# Patient Record
Sex: Male | Born: 1965 | Race: White | Hispanic: No | Marital: Married | Smoking: Never smoker
Health system: Southern US, Community
[De-identification: ages and names within clinical notes are randomized; demographics above are authoritative.]

## PROBLEM LIST (undated history)

## (undated) DIAGNOSIS — M545 Low back pain, unspecified: Secondary | ICD-10-CM

## (undated) DIAGNOSIS — G40309 Generalized idiopathic epilepsy and epileptic syndromes, not intractable, without status epilepticus: Secondary | ICD-10-CM

## (undated) DIAGNOSIS — Z8739 Personal history of other diseases of the musculoskeletal system and connective tissue: Secondary | ICD-10-CM

## (undated) DIAGNOSIS — M5136 Other intervertebral disc degeneration, lumbar region: Secondary | ICD-10-CM

## (undated) HISTORY — DX: Low back pain, unspecified: M54.50

## (undated) HISTORY — DX: Other intervertebral disc degeneration, lumbar region: M51.36

## (undated) HISTORY — DX: Personal history of other diseases of the musculoskeletal system and connective tissue: Z87.39

## (undated) HISTORY — DX: Low back pain: M54.5

## (undated) HISTORY — DX: Generalized idiopathic epilepsy and epileptic syndromes, not intractable, without status epilepticus: G40.309

---

## 2010-06-07 ENCOUNTER — Emergency Department (HOSPITAL_COMMUNITY)
Admission: EM | Admit: 2010-06-07 | Discharge: 2010-06-07 | Payer: Self-pay | Source: Home / Self Care | Admitting: Emergency Medicine

## 2011-08-12 ENCOUNTER — Other Ambulatory Visit: Payer: BC Managed Care – PPO

## 2011-08-12 ENCOUNTER — Ambulatory Visit (INDEPENDENT_AMBULATORY_CARE_PROVIDER_SITE_OTHER): Payer: BC Managed Care – PPO | Admitting: Family Medicine

## 2011-08-12 ENCOUNTER — Encounter: Payer: Self-pay | Admitting: Family Medicine

## 2011-08-12 ENCOUNTER — Other Ambulatory Visit: Payer: Self-pay | Admitting: Family Medicine

## 2011-08-12 VITALS — BP 118/85 | HR 78 | Temp 98.2°F | Ht 73.0 in | Wt 197.0 lb

## 2011-08-12 DIAGNOSIS — G40909 Epilepsy, unspecified, not intractable, without status epilepticus: Secondary | ICD-10-CM

## 2011-08-12 DIAGNOSIS — Z23 Encounter for immunization: Secondary | ICD-10-CM

## 2011-08-12 DIAGNOSIS — G40309 Generalized idiopathic epilepsy and epileptic syndromes, not intractable, without status epilepticus: Secondary | ICD-10-CM

## 2011-08-12 DIAGNOSIS — M545 Low back pain: Secondary | ICD-10-CM

## 2011-08-12 LAB — AST: AST: 21 U/L (ref 0–37)

## 2011-08-12 LAB — ALT: ALT: 26 U/L (ref 0–53)

## 2011-08-12 MED ORDER — CYCLOBENZAPRINE HCL 10 MG PO TABS: ORAL_TABLET | ORAL | Status: AC

## 2011-08-12 NOTE — Progress Notes (Signed)
Office Note 08/14/2011  CC:  Chief Complaint  Patient presents with  . Establish Care    manage meds for epilepsy    HPI:  Nicholas Sellers is a 46 y.o. White male who is here to establish care. Patient's most recent primary MD: Dr. Leary Roca in [place], [place].  Also saw an orthopedist named Dr. Luiz Blare for his back and his left shoulder. Old records were not reviewed prior to or during today's visit.  He has no acute c/o except his "annual" spell of LBP has occurred recently and is getting better slowly but he is needing RF of cyclobenzaprine.  Takes some ibuprofen bid-tid prn for back pain lately as well.  He needs routine lab work monitoring for his depakote (generalized tonic-clonic sz d/o).  He is compliant with meds, most recent dose was this morning just a couple of hours ago.    Past Medical History  Diagnosis Date  . Seizure disorder, primary generalized 08/13/2011    Seizure-free since 1993 while on depakote (trial of ween off depakote about 2000--sleep deprived portion of EEG still abnormal on lower dose so original dose resumed).  No hx of depakote side effect or toxicity.  . Episodic low back pain     Usually resolves in a week or so with flexeril, stretches, NSAIDs, relative rest  . History of dislocation of shoulder x 2    Left; first time was from his 1st seizure, another time more recently was from falling onto shoulder while running--went through PT and is fine now.    History reviewed. No pertinent past surgical history.  Family History  Problem Relation Age of Onset  . Arthritis Mother   . Arthritis Father   . Seizures Brother   . Heart disease Maternal Grandfather     died of MI  . Heart disease Paternal Grandmother     died of MI    History   Social History  . Marital Status: Married    Spouse Name: N/A    Number of Children: N/A  . Years of Education: N/A   Occupational History  . Not on file.   Social History Main Topics  . Smoking status:  Never Smoker   . Smokeless tobacco: Never Used  . Alcohol Use: 3.0 - 6.0 oz/week    5-10 Glasses of wine per week  . Drug Use: No  . Sexually Active: Not on file   Other Topics Concern  . 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    Outpatient Encounter Prescriptions as of 08/12/2011  Medication Sig Dispense Refill  . cyclobenzaprine (FLEXERIL) 10 MG tablet 1 tab po tid prn low back pain  30 tablet  3  . divalproex (DEPAKOTE) 500 MG DR tablet Take 1,000 mg by mouth daily.      Marland Kitchen DISCONTD: cyclobenzaprine (FLEXERIL) 10 MG tablet Take 1 tablet by mouth Every 8 hours as needed.        Allergies  Allergen Reactions  . Sulfa Antibiotics     Infant, unknown reaction    ROS Review of Systems  Constitutional: Negative for fever, chills, appetite change and fatigue.  HENT: Negative for ear pain, congestion, sore throat, neck stiffness and dental problem.   Eyes: Negative for discharge, redness and visual disturbance.  Respiratory: Negative for cough, chest  tightness, shortness of breath and wheezing.   Cardiovascular: Negative for chest pain, palpitations and leg swelling.  Gastrointestinal: Negative for nausea, vomiting, abdominal pain, diarrhea and blood in stool.  Genitourinary: Negative for dysuria, urgency, frequency, hematuria, flank pain and difficulty urinating.  Musculoskeletal: Positive for back pain (mild/mod low back pain centrally, without radicular sx's). Negative for myalgias, joint swelling and arthralgias.  Skin: Negative for pallor and rash.  Neurological: Negative for dizziness, speech difficulty, weakness and headaches.  Hematological: Negative for adenopathy. Does not bruise/bleed easily.  Psychiatric/Behavioral: Negative for confusion and sleep  disturbance. The patient is not nervous/anxious.     PE; Blood pressure 118/85, pulse 78, temperature 98.2 F (36.8 C), temperature source Temporal, height 6\' 1"  (1.854 m), weight 197 lb (89.359 kg), SpO2 98.00%. Gen: Alert, well appearing.  Patient is oriented to person, place, time, and situation. ENT:  Eyes: no injection, icteris, swelling, or exudate.  EOMI, PERRLA. Nose: no drainage or turbinate edema/swelling.  No injection or focal lesion.  Mouth: lips without lesion/swelling.  Oral mucosa pink and moist.  Dentition intact and without obvious caries or gingival swelling.  Oropharynx without erythema, exudate, or swelling.  Neck - No masses or thyromegaly or limitation in range of motion CV: RRR, no m/r/g.   LUNGS: CTA bilat, nonlabored resps, good aeration in all lung fields. ABD: soft, NT, ND, BS normal.  No hepatospenomegaly or mass.  No bruits. EXT: no clubbing, cyanosis, or edema.  Back: ROM intact with mild pain with flexion and extension.  No signif TTP, no deformity.    Pertinent labs:  None today  ASSESSMENT AND PLAN:   New pt: obtain old records.  Seizure disorder, primary generalized Stable. He'll return late this afternoon for valproate level, CBC, and AST/ALT. Will RF depakote 90 day supply mail order. Routine f/u for this q855mo.  Low back pain Currently in the midst of a resolving flare of musculoskeletal LBP. RF'd flexeril today.  Need for Tdap vaccination Tdap given today.     Return in about 6 months (around 02/12/2012) for lab visit at his convenience (needs 4 or 4: 15 appt).  55mo f/u for CPE.

## 2011-08-13 ENCOUNTER — Encounter: Payer: Self-pay | Admitting: Family Medicine

## 2011-08-13 DIAGNOSIS — G40309 Generalized idiopathic epilepsy and epileptic syndromes, not intractable, without status epilepticus: Secondary | ICD-10-CM

## 2011-08-13 HISTORY — DX: Generalized idiopathic epilepsy and epileptic syndromes, not intractable, without status epilepticus: G40.309

## 2011-08-13 LAB — CBC WITH DIFFERENTIAL/PLATELET
Basophils Absolute: 0 10*3/uL (ref 0.0–0.1)
Lymphocytes Relative: 43 % (ref 12–46)
Lymphs Abs: 2.8 10*3/uL (ref 0.7–4.0)
MCV: 86.2 fL (ref 78.0–100.0)
Neutro Abs: 3.2 10*3/uL (ref 1.7–7.7)
Neutrophils Relative %: 49 % (ref 43–77)
Platelets: 237 10*3/uL (ref 150–400)
RBC: 5.07 MIL/uL (ref 4.22–5.81)
RDW: 12.6 % (ref 11.5–15.5)
WBC: 6.4 10*3/uL (ref 4.0–10.5)

## 2011-08-13 LAB — VALPROIC ACID LEVEL: Valproic Acid Lvl: 41.6 ug/mL — ABNORMAL LOW (ref 50.0–100.0)

## 2011-08-14 ENCOUNTER — Encounter: Payer: Self-pay | Admitting: Family Medicine

## 2011-08-14 DIAGNOSIS — M545 Low back pain: Secondary | ICD-10-CM | POA: Insufficient documentation

## 2011-08-14 DIAGNOSIS — Z23 Encounter for immunization: Secondary | ICD-10-CM | POA: Insufficient documentation

## 2011-08-14 NOTE — Assessment & Plan Note (Signed)
Tdap given today.

## 2011-08-14 NOTE — Assessment & Plan Note (Signed)
Currently in the midst of a resolving flare of musculoskeletal LBP. RF'd flexeril today.

## 2011-08-14 NOTE — Assessment & Plan Note (Signed)
Stable. He'll return late this afternoon for valproate level, CBC, and AST/ALT. Will RF depakote 90 day supply mail order. Routine f/u for this q33mo.

## 2011-08-15 ENCOUNTER — Telehealth: Payer: Self-pay | Admitting: Family Medicine

## 2011-08-15 NOTE — Telephone Encounter (Signed)
Patient wanted to check to see if you have the form you need to send in mail order Rx for Depakote

## 2011-08-15 NOTE — Telephone Encounter (Signed)
Nicholas Sellers we have faxed, but I will fax again.  If they have not heard by Monday to call us back.  Not a problem to help them get this worked out.  She is appreciative.

## 2011-08-22 ENCOUNTER — Telehealth: Payer: Self-pay | Admitting: *Deleted

## 2011-08-22 MED ORDER — DIVALPROEX SODIUM ER 500 MG PO TB24: 1000.0000 mg | ORAL_TABLET | Freq: Every day | ORAL | Status: DC

## 2011-08-22 NOTE — Telephone Encounter (Signed)
Fax from [facility] for clarification on dosing.  Per bottle that pt brought with him to visit Dr. Milinda Cave and myself read "DR".  Pt called and stated that bottle does say ER and he has been taking that dose.  RX fixed with [facility].

## 2011-09-08 ENCOUNTER — Encounter: Payer: Self-pay | Admitting: Family Medicine

## 2011-09-08 DIAGNOSIS — Z125 Encounter for screening for malignant neoplasm of prostate: Secondary | ICD-10-CM | POA: Insufficient documentation

## 2011-11-28 ENCOUNTER — Encounter: Payer: Self-pay | Admitting: Family Medicine

## 2011-11-28 ENCOUNTER — Ambulatory Visit (INDEPENDENT_AMBULATORY_CARE_PROVIDER_SITE_OTHER): Payer: BC Managed Care – PPO | Admitting: Family Medicine

## 2011-11-28 VITALS — BP 129/86 | HR 73 | Temp 98.1°F | Ht 73.0 in | Wt 183.0 lb

## 2011-11-28 DIAGNOSIS — N419 Inflammatory disease of prostate, unspecified: Secondary | ICD-10-CM

## 2011-11-28 DIAGNOSIS — N410 Acute prostatitis: Secondary | ICD-10-CM

## 2011-11-28 LAB — POCT URINALYSIS DIPSTICK
Blood, UA: NEGATIVE
Leukocytes, UA: NEGATIVE
Nitrite, UA: NEGATIVE
Protein, UA: NEGATIVE
pH, UA: 7.5

## 2011-11-28 MED ORDER — CIPROFLOXACIN HCL 500 MG PO TABS: 500.0000 mg | ORAL_TABLET | Freq: Two times a day (BID) | ORAL | Status: AC

## 2011-11-28 NOTE — Progress Notes (Signed)
OFFICE NOTE  11/28/2011  CC:  Chief Complaint  Patient presents with  . urinary problems    x 1 month     HPI: Patient is a 46 y.o. Caucasian male who is here for urinary complaints. Feels like he has to urinate frequently, sometimes just dribbles urine out.  No dysuria.  Going on 1 month or so, was initially "all the time" and now seems to be more sporadic.  Some leakage of urine.  Some nights has nocturia x 4, and he never usually goes at night.   No flank pain, no fevers, no stomach aches.   No hx of prostatitis.  No excessive thirst.  Pertinent PMH:  Past Medical History  Diagnosis Date  . Seizure disorder, primary generalized 08/13/2011    Seizure-free since 1993 while on depakote (trial of ween off depakote about 2000--sleep deprived portion of EEG still abnormal on lower dose so original dose resumed).  No hx of depakote side effect or toxicity.  . Episodic low back pain     Usually resolves in a week or so with flexeril, stretches, NSAIDs, relative rest  . History of dislocation of shoulder x 2    Left; first time was from his 1st seizure, another time more recently was from falling onto shoulder while running--went through PT and is fine now.   Past surgical, social, and family history reviewed and no changes noted since last office visit.  MEDS:  Outpatient Prescriptions Prior to Visit  Medication Sig Dispense Refill  . cyclobenzaprine (FLEXERIL) 10 MG tablet 1 tab po tid prn low back pain  30 tablet  3  . divalproex (DEPAKOTE ER) 500 MG 24 hr tablet Take 2 tablets (1,000 mg total) by mouth daily.  180 tablet  3    PE: Blood pressure 129/86, pulse 73, temperature 98.1 F (36.7 C), temperature source Oral, height 6\' 1"  (1.854 m), weight 183 lb (83.008 kg). Gen: Alert, well appearing.  Patient is oriented to person, place, time, and situation. RECTAL: normal tone, no mass, no tenderness in prostate.  Prostate smooth, without induration or nodule.  UA:  normal  IMPRESSION AND PLAN: Acute prostatitis Will treat with 2 week course of cipro 500 bid. Recheck in 2 wks.  If not improved, will check PSA and discuss treatment for BPH.     FOLLOW UP: 2 wks

## 2011-11-28 NOTE — Assessment & Plan Note (Signed)
Will treat with 2 week course of cipro 500 bid. Recheck in 2 wks.  If not improved, will check PSA and discuss treatment for BPH.

## 2011-12-12 ENCOUNTER — Encounter: Payer: Self-pay | Admitting: Family Medicine

## 2011-12-12 ENCOUNTER — Ambulatory Visit (INDEPENDENT_AMBULATORY_CARE_PROVIDER_SITE_OTHER): Payer: BC Managed Care – PPO | Admitting: Family Medicine

## 2011-12-12 VITALS — BP 114/73 | HR 78 | Ht 73.0 in | Wt 183.0 lb

## 2011-12-12 DIAGNOSIS — N410 Acute prostatitis: Secondary | ICD-10-CM

## 2011-12-12 NOTE — Progress Notes (Signed)
OFFICE NOTE  12/12/2011  CC:  Chief Complaint  Patient presents with  . Follow-up    prostatitis     HPI: Patient is a 46 y.o. Caucasian male who is here for 2 wk f/u for prostatitis, treated with a course of cipro. Finished cipro yesterday, says sx's have completely cleared up. No new questions or complaints today.  Pertinent PMH:  Past Medical History  Diagnosis Date  . Seizure disorder, primary generalized 08/13/2011    Seizure-free since 1993 while on depakote (trial of ween off depakote about 2000--sleep deprived portion of EEG still abnormal on lower dose so original dose resumed).  No hx of depakote side effect or toxicity.  . Episodic low back pain     Usually resolves in a week or so with flexeril, stretches, NSAIDs, relative rest  . History of dislocation of shoulder x 2    Left; first time was from his 1st seizure, another time more recently was from falling onto shoulder while running--went through PT and is fine now.    MEDS:  Outpatient Prescriptions Prior to Visit  Medication Sig Dispense Refill  . cyclobenzaprine (FLEXERIL) 10 MG tablet 1 tab po tid prn low back pain  30 tablet  3  . divalproex (DEPAKOTE ER) 500 MG 24 hr tablet Take 2 tablets (1,000 mg total) by mouth daily.  180 tablet  3  . Multiple Vitamin (MULTIVITAMIN) tablet Take 1 tablet by mouth daily.      . Omega-3 Fatty Acids (FISH OIL) 1000 MG CAPS Take 1 capsule by mouth daily.        PE: Blood pressure 114/73, pulse 78, height 6\' 1"  (1.854 m), weight 183 lb (83.008 kg). Gen: Alert, well appearing.  Patient is oriented to person, place, time, and situation. Affect: pleasant.  Thought and speech lucid.   No further exam today.  IMPRESSION AND PLAN:  Acute prostatitis Completely resolved with a course of cipro.     FOLLOW UP: 8 mo or so, for annual CPE.

## 2011-12-12 NOTE — Assessment & Plan Note (Signed)
Completely resolved with a course of cipro.

## 2011-12-15 ENCOUNTER — Telehealth: Payer: Self-pay | Admitting: Family Medicine

## 2011-12-15 MED ORDER — DIVALPROEX SODIUM ER 500 MG PO TB24: 1000.0000 mg | ORAL_TABLET | Freq: Every day | ORAL | Status: DC

## 2011-12-15 NOTE — Telephone Encounter (Signed)
2 week supply sent

## 2012-05-24 ENCOUNTER — Other Ambulatory Visit: Payer: Self-pay | Admitting: Family Medicine

## 2012-06-04 ENCOUNTER — Telehealth: Payer: Self-pay | Admitting: Family Medicine

## 2012-06-04 MED ORDER — DIVALPROEX SODIUM ER 500 MG PO TB24: 1000.0000 mg | ORAL_TABLET | Freq: Every day | ORAL | Status: DC

## 2012-06-04 NOTE — Telephone Encounter (Signed)
Patient has received Depakote in mail. Pharmacy claims that the Rx was written with brand only do not substitute. Please

## 2012-06-04 NOTE — Telephone Encounter (Signed)
Please contact patient

## 2012-06-04 NOTE — Telephone Encounter (Signed)
Pt received brand depakote when he has been receiving generic.  Pt was charged $210 when his usual charge is $10.  Pt has spoken to mail order pharmacy when charge was made and was told that they had already processed and shipped med to him.  Pt asked if package was unopened would he be able to ship it back to them.  Pt was told they are not allowed to take any medication back once it has been shipped.   Apologized for our error.  Advised pt that from the screens I was able to see, I do not know why his script was changed to DAW. Pt wants to know what we are going to be able to do to help him as our error has cost him an extra $200 that he can not afford.  Advised I would notify our office supervisor and she would be his contact for this issue from this point forward.  [facility] name given to pt as office supervisor. At patient's request I sent a refill not [facility] as [facility] so they would have to correct RX on file.  Advised pt to call when next refill is due and ask for me and I will make sure RX is sent in correctly. Pt feeling somewhat better and is awaiting call back from office supervisor.

## 2012-06-09 NOTE — Telephone Encounter (Signed)
Spoke with Donelle's wife.  I will handle possible reimbursement on business end.

## 2012-08-31 DIAGNOSIS — M51369 Other intervertebral disc degeneration, lumbar region without mention of lumbar back pain or lower extremity pain: Secondary | ICD-10-CM

## 2012-08-31 DIAGNOSIS — M5136 Other intervertebral disc degeneration, lumbar region: Secondary | ICD-10-CM

## 2012-08-31 HISTORY — DX: Other intervertebral disc degeneration, lumbar region without mention of lumbar back pain or lower extremity pain: M51.369

## 2012-08-31 HISTORY — DX: Other intervertebral disc degeneration, lumbar region: M51.36

## 2012-09-03 ENCOUNTER — Encounter: Payer: Self-pay | Admitting: Family Medicine

## 2012-09-03 ENCOUNTER — Ambulatory Visit (INDEPENDENT_AMBULATORY_CARE_PROVIDER_SITE_OTHER): Payer: BC Managed Care – PPO | Admitting: Family Medicine

## 2012-09-03 VITALS — BP 110/80 | HR 68 | Ht 73.0 in | Wt 176.0 lb

## 2012-09-03 DIAGNOSIS — Z79899 Other long term (current) drug therapy: Secondary | ICD-10-CM

## 2012-09-03 DIAGNOSIS — G40909 Epilepsy, unspecified, not intractable, without status epilepticus: Secondary | ICD-10-CM

## 2012-09-03 DIAGNOSIS — M545 Low back pain, unspecified: Secondary | ICD-10-CM

## 2012-09-03 DIAGNOSIS — G40309 Generalized idiopathic epilepsy and epileptic syndromes, not intractable, without status epilepticus: Secondary | ICD-10-CM

## 2012-09-03 LAB — CBC WITH DIFFERENTIAL/PLATELET
Basophils Absolute: 0 10*3/uL (ref 0.0–0.1)
Basophils Relative: 0.5 % (ref 0.0–3.0)
HCT: 42.8 % (ref 39.0–52.0)
Hemoglobin: 14.4 g/dL (ref 13.0–17.0)
Lymphocytes Relative: 42.3 % (ref 12.0–46.0)
Lymphs Abs: 1.8 10*3/uL (ref 0.7–4.0)
MCHC: 33.8 g/dL (ref 30.0–36.0)
Monocytes Relative: 9 % (ref 3.0–12.0)
Neutro Abs: 2 10*3/uL (ref 1.4–7.7)
RBC: 4.77 Mil/uL (ref 4.22–5.81)
RDW: 13 % (ref 11.5–14.6)

## 2012-09-03 LAB — COMPREHENSIVE METABOLIC PANEL
ALT: 19 U/L (ref 0–53)
AST: 19 U/L (ref 0–37)
BUN: 15 mg/dL (ref 6–23)
Calcium: 9.4 mg/dL (ref 8.4–10.5)
Chloride: 100 mEq/L (ref 96–112)
Creatinine, Ser: 0.9 mg/dL (ref 0.4–1.5)
GFR: 91.98 mL/min (ref >60.00)
Total Bilirubin: 0.4 mg/dL (ref 0.3–1.2)

## 2012-09-03 MED ORDER — DIVALPROEX SODIUM ER 500 MG PO TB24: 1000.0000 mg | ORAL_TABLET | Freq: Every day | ORAL | Status: DC

## 2012-09-03 NOTE — Progress Notes (Signed)
OFFICE NOTE  09/03/2012  CC:  Chief Complaint  Patient presents with  . Pain    back and legs     HPI: Patient is a 47 y.o. Caucasian male who is here for back and leg pain.   Pt reports hx of chronic LBP at intensity of 2/10-5/10 on most days.  For the last 8d he has felt significantly worsened intensity of LBP, plus slight numbness/tingling of lateral buttocks and legs, +feet numbness-particularly laterally.  Reports flares of LBP over the years, had PT 2-3 years ago and it did not help. Has been taking ibuprofen 400 mg tid x 1wk--no help.  No particular position or thing makes his back pain better or worse. Seems like ever since an acute strain 04/2011 he has been more consistently worse.  Asks for RF of depakote for his seizure d/o.  He has not had a seizure in years.    Pertinent PMH:  Past Medical History  Diagnosis Date  . Seizure disorder, primary generalized 08/13/2011    Seizure-free since 1993 while on depakote (trial of ween off depakote about 2000--sleep deprived portion of EEG still abnormal on lower dose so original dose resumed).  No hx of depakote side effect or toxicity.  . Episodic low back pain     Usually resolves in a week or so with flexeril, stretches, NSAIDs, relative rest  . History of dislocation of shoulder x 2    Left; first time was from his 1st seizure, another time more recently was from falling onto shoulder while running--went through PT and is fine now.   No past surgical history on file. Past family and social history reviewed and there are no changes since the patient's last office visit with me.  MEDS:  Outpatient Prescriptions Prior to Visit  Medication Sig Dispense Refill  . cyclobenzaprine (FLEXERIL) 10 MG tablet 1 tab po tid prn low back pain  30 tablet  3  . divalproex (DEPAKOTE ER) 500 MG 24 hr tablet Take 2 tablets (1,000 mg total) by mouth daily.  180 tablet  0  . Multiple Vitamin (MULTIVITAMIN) tablet Take 1 tablet by mouth daily.        No facility-administered medications prior to visit.    PE: Blood pressure 110/80, pulse 68, height 6\' 1"  (1.854 m), weight 176 lb (79.833 kg). Gen: Alert, well appearing.  Patient is oriented to person, place, time, and situation. ENT:  Eyes: no injection, icteris, swelling, or exudate.  EOMI, PERRLA. Nose: no drainage or turbinate edema/swelling.  No injection or focal lesion.  Mouth: lips without lesion/swelling.  Oral mucosa pink and moist.    Oropharynx without erythema, exudate, or swelling.  CV: RRR, no m/r/g.   LUNGS: CTA bilat, nonlabored resps, good aeration in all lung fields. BACK: mild ache in LB diffusely with Lumbar ROM, particularly with flexion (he can flex to about 90 degrees only). Minimal diffuse LB TTP.  FABER maneuver elicits L/S central pain when done on each side.  Sitting SLR neg bilat. Neuro: LE sensation intact.  Strength 5/5 prox/dist bilat.  DTRS 1+ in patellar and achilles areas bilat.   IMPRESSION AND PLAN:  Low back pain Acute-on-chronic, multifactorial (myofascial, SI joint pain, and possibly DDD/spondylosis). Due to chronicity and history of PT being unhelpful in the remote past, will pursue MRI w/out contrast of L/S spine. He'll continue ibuprofen 400-600 mg tid with food.  He declined short term narcotic pain med today.  Seizure disorder, primary generalized Stable. Check CBC and  CMET for routine monitoring on depakote. Depakote RF given.   An After Visit Summary was printed and given to the patient.  FOLLOW UP: to be determined based on results of labs/MRI

## 2012-09-07 ENCOUNTER — Encounter: Payer: Self-pay | Admitting: Family Medicine

## 2012-09-07 NOTE — Assessment & Plan Note (Signed)
Acute-on-chronic, multifactorial (myofascial, SI joint pain, and possibly DDD/spondylosis). Due to chronicity and history of PT being unhelpful in the remote past, will pursue MRI w/out contrast of L/S spine. He'll continue ibuprofen 400-600 mg tid with food.  He declined short term narcotic pain med today.

## 2012-09-07 NOTE — Assessment & Plan Note (Signed)
Stable. Check CBC and CMET for routine monitoring on depakote. Depakote RF given.

## 2012-09-14 ENCOUNTER — Ambulatory Visit (HOSPITAL_BASED_OUTPATIENT_CLINIC_OR_DEPARTMENT_OTHER)
Admission: RE | Admit: 2012-09-14 | Discharge: 2012-09-14 | Disposition: A | Discharge status: Home / Self Care | Payer: BC Managed Care – PPO | Source: Ambulatory Visit | Attending: Family Medicine | Admitting: Family Medicine

## 2012-09-14 DIAGNOSIS — M545 Low back pain, unspecified: Secondary | ICD-10-CM | POA: Insufficient documentation

## 2012-09-14 DIAGNOSIS — M5126 Other intervertebral disc displacement, lumbar region: Secondary | ICD-10-CM | POA: Insufficient documentation

## 2012-09-14 DIAGNOSIS — M79609 Pain in unspecified limb: Secondary | ICD-10-CM | POA: Insufficient documentation

## 2012-09-14 DIAGNOSIS — R209 Unspecified disturbances of skin sensation: Secondary | ICD-10-CM | POA: Insufficient documentation

## 2012-09-14 DIAGNOSIS — Z79899 Other long term (current) drug therapy: Secondary | ICD-10-CM

## 2012-09-15 ENCOUNTER — Other Ambulatory Visit: Payer: Self-pay | Admitting: Family Medicine

## 2012-09-15 DIAGNOSIS — M5416 Radiculopathy, lumbar region: Secondary | ICD-10-CM

## 2012-09-15 DIAGNOSIS — M5137 Other intervertebral disc degeneration, lumbosacral region: Secondary | ICD-10-CM

## 2012-09-15 NOTE — Progress Notes (Signed)
NS referral ordered

## 2012-09-20 ENCOUNTER — Telehealth: Payer: Self-pay | Admitting: Family Medicine

## 2012-09-20 NOTE — Telephone Encounter (Signed)
LMOM with contact name and number for return call RE: clarification on request; pt given MRI results on 04.15.14/SLS  Notes Recorded by April M Miller, CMA on 09/14/2012 at 4:21 PM Patient returned call and was given MRI results. Patient stated that he does not have a preference concerning the neurosurgeon, however he would rather see someone in [place]. ------

## 2012-09-20 NOTE — Telephone Encounter (Signed)
Spoke w/pt & went over Radiology report at pt request [disk protrusions]; wanted to understand why he was being referred to Neuro; pt informed, understood & agreed/SLS

## 2012-09-20 NOTE — Telephone Encounter (Signed)
Patient is requesting a CB. He would like to know the results from his MRI.

## 2012-10-05 ENCOUNTER — Telehealth: Payer: Self-pay | Admitting: *Deleted

## 2012-10-05 NOTE — Telephone Encounter (Signed)
Patient's wife called RE: appointment for pt, stating that they "believe he may have Diabetes and/or a Vitamin deficiency causing pt's neuropathy"; appt w/lab work requested stating "know pt needs to be fasting for glucose and that pt hasn't had a diabetes check in years". After researching pt's EMR, pt does not have DM and/or a Hx of DM, pt was in office 04.04.14 for bloodwork; back & neck pain; numbness in feet-All lab results were normal. Pt established care 03.12.2013 and was due for f/u in September '13 and was to schedule appointment in March '14 for Annual Exam/CPE; neither of these appts were made. LMOM with contact name and number for return call RE: scheduling OV for CPE and to inform that all questions and/or concerns mentioned would be addressed during that visit, provider informed & agreed/SLS

## 2012-10-07 ENCOUNTER — Other Ambulatory Visit (INDEPENDENT_AMBULATORY_CARE_PROVIDER_SITE_OTHER): Payer: BC Managed Care – PPO

## 2012-10-07 DIAGNOSIS — Z0000 Encounter for general adult medical examination without abnormal findings: Secondary | ICD-10-CM

## 2012-10-07 LAB — CBC WITH DIFFERENTIAL/PLATELET
Basophils Absolute: 0 10*3/uL (ref 0.0–0.1)
Eosinophils Relative: 1.7 % (ref 0.0–5.0)
HCT: 44.2 % (ref 39.0–52.0)
Hemoglobin: 15 g/dL (ref 13.0–17.0)
Lymphocytes Relative: 45.7 % (ref 12.0–46.0)
Monocytes Relative: 8.6 % (ref 3.0–12.0)
Neutro Abs: 1.9 10*3/uL (ref 1.4–7.7)
RBC: 4.88 Mil/uL (ref 4.22–5.81)
RDW: 13.4 % (ref 11.5–14.6)
WBC: 4.4 10*3/uL — ABNORMAL LOW (ref 4.5–10.5)

## 2012-10-07 LAB — HEPATIC FUNCTION PANEL
ALT: 24 U/L (ref 0–53)
AST: 23 U/L (ref 0–37)
Albumin: 4 g/dL (ref 3.5–5.2)
Alkaline Phosphatase: 40 U/L (ref 39–117)
Bilirubin, Direct: 0.1 mg/dL (ref 0.0–0.3)
Total Protein: 6.6 g/dL (ref 6.0–8.3)

## 2012-10-07 LAB — RENAL FUNCTION PANEL
Albumin: 4 g/dL (ref 3.5–5.2)
BUN: 16 mg/dL (ref 6–23)
Calcium: 9.1 mg/dL (ref 8.4–10.5)
Chloride: 103 mEq/L (ref 96–112)
Potassium: 4.1 mEq/L (ref 3.5–5.1)

## 2012-10-07 LAB — TSH: TSH: 3.41 u[IU]/mL (ref 0.35–5.50)

## 2012-10-07 LAB — LIPID PANEL: Total CHOL/HDL Ratio: 3

## 2012-10-07 NOTE — Progress Notes (Signed)
Labs only

## 2012-10-11 ENCOUNTER — Ambulatory Visit (INDEPENDENT_AMBULATORY_CARE_PROVIDER_SITE_OTHER): Payer: BC Managed Care – PPO | Admitting: Family Medicine

## 2012-10-11 ENCOUNTER — Encounter: Payer: Self-pay | Admitting: Family Medicine

## 2012-10-11 VITALS — BP 116/77 | HR 62 | Temp 98.0°F | Resp 14 | Ht 72.0 in | Wt 176.5 lb

## 2012-10-11 DIAGNOSIS — Z0000 Encounter for general adult medical examination without abnormal findings: Secondary | ICD-10-CM

## 2012-10-11 NOTE — Progress Notes (Signed)
Office Note 10/24/2012  CC:  Chief Complaint  Patient presents with  . Annual Exam    HPI:  Nicholas Sellers is a 47 y.o. White male who is here for CPE today. No acute complaints. He is in the midst of a neurosurg w/u for paresthesias/radicular pain with chronic low back pain.  He relates that there are plans for NCS and EMGs in the works.    Past Medical History  Diagnosis Date  . Seizure disorder, primary generalized 08/13/2011    Seizure-free since 1993 while on depakote (trial of ween off depakote about 2000--sleep deprived portion of EEG still abnormal on lower dose so original dose resumed).  No hx of depakote side effect or toxicity.  . Episodic low back pain     Usually resolves in a week or so with flexeril, stretches, NSAIDs, relative rest  . History of dislocation of shoulder x 2    Left; first time was from his 1st seizure, another time more recently was from falling onto shoulder while running--went through PT and is fine now.    No past surgical history on file.  Family History  Problem Relation Age of Onset  . Arthritis Mother   . Arthritis Father   . Seizures Brother   . Heart disease Maternal Grandfather     died of MI  . Heart disease Paternal Grandmother     died of MI    History   Social History  . Marital Status: Married    Spouse Name: N/A    Number of Children: N/A  . Years of Education: N/A   Occupational History  . Not on file.   Social History Main Topics  . Smoking status: Never Smoker   . Smokeless tobacco: Never Used  . Alcohol Use: 3 - 6 oz/week    5-10 Glasses of wine per week  . Drug Use: No  . Sexually Active: Not on file   Other Topics Concern  . Not on file   Social History Narrative   Married, 2 children (ages 91 and 10 yrs).   Relocated from [place], [place] in 2011.   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   College--[employer].   Enjoys woodworking and exercise.   No T/A/Ds.     Outpatient Prescriptions Prior to Visit  Medication Sig Dispense Refill  . cyclobenzaprine (FLEXERIL) 10 MG tablet 1 tab po tid prn low back pain  30 tablet  3  . divalproex (DEPAKOTE ER) 500 MG 24 hr tablet Take 2 tablets (1,000 mg total) by mouth daily.  180 tablet  0  . Multiple Vitamin (MULTIVITAMIN) tablet Take 1 tablet by mouth daily.       No facility-administered medications prior to visit.    Allergies  Allergen Reactions  . Sulfa Antibiotics     Infant, unknown reaction    ROS Review of Systems  Constitutional: Negative for fever, chills, appetite change and fatigue.  HENT: Negative for ear pain, congestion, sore throat, neck stiffness and dental problem.   Eyes: Negative for discharge, redness and visual disturbance.  Respiratory: Negative for cough, chest tightness, shortness of breath and wheezing.   Cardiovascular: Negative for chest pain, palpitations and leg swelling.  Gastrointestinal: Negative for nausea, vomiting, abdominal pain, diarrhea and blood in stool.  Genitourinary: Negative for dysuria, urgency, frequency, hematuria, flank pain and difficulty urinating.  Musculoskeletal: Negative for myalgias, back pain, joint swelling and arthralgias.  Skin: Negative for pallor  and rash.  Neurological: Negative for dizziness, speech difficulty, weakness and headaches.  Hematological: Negative for adenopathy. Does not bruise/bleed easily.  Psychiatric/Behavioral: Negative for confusion and sleep disturbance. The patient is not nervous/anxious.     PE; Blood pressure 116/77, pulse 62, temperature 98 F (36.7 C), temperature source Oral, resp. rate 14, height 6' (1.829 m), weight 176 lb 8 oz (80.06 kg), SpO2 98.00%. Gen: Alert, well appearing.  Patient is oriented to person, place, time, and situation. AFFECT: pleasant, lucid thought and speech. ENT: Ears: EACs clear, normal epithelium.  TMs with good light reflex and landmarks bilaterally.  Eyes: no injection,  icteris, swelling, or exudate.  EOMI, PERRLA. Nose: no drainage or turbinate edema/swelling.  No injection or focal lesion.  Mouth: lips without lesion/swelling.  Oral mucosa pink and moist.  Dentition intact and without obvious caries or gingival swelling.  Oropharynx without erythema, exudate, or swelling.  Neck: supple/nontender.  No LAD, mass, or TM.  Carotid pulses 2+ bilaterally, without bruits. CV: RRR, no m/r/g.   LUNGS: CTA bilat, nonlabored resps, good aeration in all lung fields. ABD: soft, NT, ND, BS normal.  No hepatospenomegaly or mass.  No bruits. EXT: no clubbing, cyanosis, or edema.  Musculoskeletal: no joint swelling, erythema, warmth, or tenderness.  ROM of all joints intact. Skin - no sores or suspicious lesions or rashes or color changes Musculoskeletal: no joint swelling, erythema, warmth, or tenderness.  ROM of all joints intact. Neuro: CN 2-12 intact bilaterally, strength 5/5 in proximal and distal upper extremities and lower extremities bilaterally.  No sensory deficits.  No tremor.  No disdiadochokinesis.  No ataxia.  Upper extremity and lower extremity DTRs symmetric.  No pronator drift. Genitals normal; both testes normal without tenderness, masses, hydroceles, varicoceles, erythema or swelling. Shaft normal, circumcised, meatus normal without discharge. No inguinal hernia noted. No inguinal lymphadenopathy.   Pertinent labs:  None today  RECENT LABS:  Lab Results  Component Value Date   TSH 3.41 10/07/2012   Lab Results  Component Value Date   WBC 4.4* 10/07/2012   HGB 15.0 10/07/2012   HCT 44.2 10/07/2012   MCV 90.6 10/07/2012   PLT 188.0 10/07/2012   Lab Results  Component Value Date   CREATININE 0.9 10/07/2012   BUN 16 10/07/2012   NA 138 10/07/2012   K 4.1 10/07/2012   CL 103 10/07/2012   CO2 30 10/07/2012   Lab Results  Component Value Date   ALT 24 10/07/2012   AST 23 10/07/2012   ALKPHOS 40 10/07/2012   BILITOT 0.6 10/07/2012   Lab Results  Component Value Date    CHOL 155 10/07/2012   Lab Results  Component Value Date   HDL 52.40 10/07/2012   Lab Results  Component Value Date   LDLCALC 86 10/07/2012   Lab Results  Component Value Date   TRIG 83.0 10/07/2012   Lab Results  Component Value Date   CHOLHDL 3 10/07/2012   No results found for this basename: PSA    Lab Results  Component Value Date   VALPROATE 41.6* 08/12/2011   ASSESSMENT AND PLAN:   Health maintenance examination Reviewed age and gender appropriate health maintenance issues (prudent diet, regular exercise, health risks of tobacco and excessive alcohol, use of seatbelts, fire alarms in home, use of sunscreen).  Also reviewed age and gender appropriate health screening as well as vaccine recommendations. No vaccines needed.  No health screening needed today.   Continue with neurosurgery for w/u of his back  pain and paresthesias/radiculopathy.  I told him to call if he gets through this and there is still no good explanation---I would then refer him to a neurologist.  FOLLOW UP:  Return in about 6 months (around 04/13/2013) for seizure disorder f/u + lab monitoring for depakote.

## 2012-10-11 NOTE — Patient Instructions (Signed)
Health Maintenance, Males A healthy lifestyle and preventative care can promote health and wellness.  Maintain regular health, dental, and eye exams.  Eat a healthy diet. Foods like vegetables, fruits, whole grains, low-fat dairy products, and lean protein foods contain the nutrients you need without too many calories. Decrease your intake of foods high in solid fats, added sugars, and salt. Get information about a proper diet from your caregiver, if necessary.  Regular physical exercise is one of the most important things you can do for your health. Most adults should get at least 150 minutes of moderate-intensity exercise (any activity that increases your heart rate and causes you to sweat) each week. In addition, most adults need muscle-strengthening exercises on 2 or more days a week.   Maintain a healthy weight. The body mass index (BMI) is a screening tool to identify possible weight problems. It provides an estimate of body fat based on height and weight. Your caregiver can help determine your BMI, and can help you achieve or maintain a healthy weight. For adults 20 years and older:  A BMI below 18.5 is considered underweight.  A BMI of 18.5 to 24.9 is normal.  A BMI of 25 to 29.9 is considered overweight.  A BMI of 30 and above is considered obese.  Maintain normal blood lipids and cholesterol by exercising and minimizing your intake of saturated fat. Eat a balanced diet with plenty of fruits and vegetables. Blood tests for lipids and cholesterol should begin at age 20 and be repeated every 5 years. If your lipid or cholesterol levels are high, you are over 50, or you are a high risk for heart disease, you may need your cholesterol levels checked more frequently.Ongoing high lipid and cholesterol levels should be treated with medicines, if diet and exercise are not effective.  If you smoke, find out from your caregiver how to quit. If you do not use tobacco, do not start.  If you  choose to drink alcohol, do not exceed 2 drinks per day. One drink is considered to be 12 ounces (355 mL) of beer, 5 ounces (148 mL) of wine, or 1.5 ounces (44 mL) of liquor.  Avoid use of street drugs. Do not share needles with anyone. Ask for help if you need support or instructions about stopping the use of drugs.  High blood pressure causes heart disease and increases the risk of stroke. Blood pressure should be checked at least every 1 to 2 years. Ongoing high blood pressure should be treated with medicines if weight loss and exercise are not effective.  If you are 45 to 46 years old, ask your caregiver if you should take aspirin to prevent heart disease.  Diabetes screening involves taking a blood sample to check your fasting blood sugar level. This should be done once every 3 years, after age 45, if you are within normal weight and without risk factors for diabetes. Testing should be considered at a younger age or be carried out more frequently if you are overweight and have at least 1 risk factor for diabetes.  Colorectal cancer can be detected and often prevented. Most routine colorectal cancer screening begins at the age of 50 and continues through age 75. However, your caregiver may recommend screening at an earlier age if you have risk factors for colon cancer. On a yearly basis, your caregiver may provide home test kits to check for hidden blood in the stool. Use of a small camera at the end of a tube,   to directly examine the colon (sigmoidoscopy or colonoscopy), can detect the earliest forms of colorectal cancer. Talk to your caregiver about this at age 50, when routine screening begins. Direct examination of the colon should be repeated every 5 to 10 years through age 75, unless early forms of pre-cancerous polyps or small growths are found.  Hepatitis C blood testing is recommended for all people born from 1945 through 1965 and any individual with known risks for hepatitis C.  Healthy  men should no longer receive prostate-specific antigen (PSA) blood tests as part of routine cancer screening. Consult with your caregiver about prostate cancer screening.  Testicular cancer screening is not recommended for adolescents or adult males who have no symptoms. Screening includes self-exam, caregiver exam, and other screening tests. Consult with your caregiver about any symptoms you have or any concerns you have about testicular cancer.  Practice safe sex. Use condoms and avoid high-risk sexual practices to reduce the spread of sexually transmitted infections (STIs).  Use sunscreen with a sun protection factor (SPF) of 30 or greater. Apply sunscreen liberally and repeatedly throughout the day. You should seek shade when your shadow is shorter than you. Protect yourself by wearing long sleeves, pants, a wide-brimmed hat, and sunglasses year round, whenever you are outdoors.  Notify your caregiver of new moles or changes in moles, especially if there is a change in shape or color. Also notify your caregiver if a mole is larger than the size of a pencil eraser.  A one-time screening for abdominal aortic aneurysm (AAA) and surgical repair of large AAAs by sound wave imaging (ultrasonography) is recommended for ages 65 to 75 years who are current or former smokers.  Stay current with your immunizations. Document Released: 11/15/2007 Document Revised: 08/11/2011 Document Reviewed: 10/14/2010 ExitCare Patient Information 2013 [employer].  

## 2012-10-12 ENCOUNTER — Encounter: Payer: BC Managed Care – PPO | Admitting: Family Medicine

## 2012-10-24 DIAGNOSIS — Z0000 Encounter for general adult medical examination without abnormal findings: Secondary | ICD-10-CM | POA: Insufficient documentation

## 2012-10-24 NOTE — Assessment & Plan Note (Signed)
Reviewed age and gender appropriate health maintenance issues (prudent diet, regular exercise, health risks of tobacco and excessive alcohol, use of seatbelts, fire alarms in home, use of sunscreen).  Also reviewed age and gender appropriate health screening as well as vaccine recommendations. No vaccines needed.  No health screening needed today.

## 2013-02-25 ENCOUNTER — Other Ambulatory Visit: Payer: Self-pay | Admitting: Family Medicine

## 2013-02-25 NOTE — Telephone Encounter (Signed)
Rx for 90 day supply sent to [facility]--pt needs f/u o/v for recheck of seizures/routine lab monitoring for being on depakote in 2 months.

## 2013-02-25 NOTE — Telephone Encounter (Signed)
Patient aware.  He will call back to schedule an appointment.  Per Dr. Milinda Cave, patient only needs labs in two months for CBC w/ diff and CMET.  Patient can just come back in May,2-15 to get his CPE.

## 2013-02-25 NOTE — Telephone Encounter (Signed)
Patient requesting refill on depakote.  Patient last seen 10/11/12.  No upcoming appointments.  Medication last printed on 09/19/12 #180 with no refill.  Please advise.

## 2013-05-02 ENCOUNTER — Encounter: Payer: Self-pay | Admitting: Family Medicine

## 2013-05-02 ENCOUNTER — Ambulatory Visit (INDEPENDENT_AMBULATORY_CARE_PROVIDER_SITE_OTHER): Payer: BC Managed Care – PPO | Admitting: Family Medicine

## 2013-05-02 VITALS — BP 134/92 | HR 76 | Temp 98.7°F | Resp 18 | Ht 73.0 in | Wt 180.0 lb

## 2013-05-02 DIAGNOSIS — Z23 Encounter for immunization: Secondary | ICD-10-CM

## 2013-05-02 DIAGNOSIS — IMO0002 Reserved for concepts with insufficient information to code with codable children: Secondary | ICD-10-CM

## 2013-05-02 DIAGNOSIS — S39011A Strain of muscle, fascia and tendon of abdomen, initial encounter: Secondary | ICD-10-CM | POA: Insufficient documentation

## 2013-05-02 NOTE — Progress Notes (Signed)
Pre visit review using our clinic review tool, if applicable. No additional management support is needed unless otherwise documented below in the visit note.  OFFICE NOTE  05/02/2013  CC:  Chief Complaint  Patient presents with  . Abdominal Pain     HPI: Patient is a 47 y.o. Caucasian male who is here for abdominal pain. Doing some exercises lately x 3 wks: core strengthening exercises + push ups.  Nothing too extreme.  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 Has felt some pain/soreness in lower abdomen that has been getting progressively worse. Sharp pain now, more in left lower quad/groin.  The pain is a soreness when not straining, and the sharp pain comes when he does push ups or physical straining.  With rest the last couple of days he feels a little better. Appetite normal.  Bowels normal.  Eating does not exacerbate his pain. No groin/scrotal bulge or protrusion noted.   Pertinent PMH:  Past Medical History  Diagnosis Date  . Seizure disorder, primary generalized 08/13/2011    Seizure-free since 1993 while on depakote (trial of ween off depakote about 2000--sleep deprived portion of EEG still abnormal on lower dose so original dose resumed).  No hx of depakote side effect or toxicity.  . Episodic low back pain     Usually resolves in a week or so with flexeril, stretches, NSAIDs, relative rest  . History of dislocation of shoulder x 2    Left; first time was from his 1st seizure, another time more recently was from falling onto shoulder while running--went through PT and is fine now.  . Lumbar degenerative disc disease 08/2012    Referred to neurosurgeon for back pain with radiculopathy   History reviewed. No pertinent past surgical history.  History   Social History Narrative   Married, 2 children (ages 23 and 10 yrs).   Relocated from [place], [place] in 2011.   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   College--[employer] [place].   Enjoys woodworking and exercise.   No T/A/Ds.    MEDS:  Outpatient Prescriptions Prior to Visit  Medication Sig Dispense Refill  . cyclobenzaprine (FLEXERIL) 10 MG tablet 1 tab po tid prn low back pain  30 tablet  3  . divalproex (DEPAKOTE ER) 500 MG 24 hr tablet TAKE 2 TABLETS (1000 MG) DAILY  180 tablet  0  . Multiple Vitamin (MULTIVITAMIN) tablet Take 1 tablet by mouth daily.       No facility-administered medications prior to visit.    PE: Blood pressure 134/92, pulse 76, temperature 98.7 F (37.1 C), temperature source Temporal, resp. rate 18, height 6\' 1"  (1.854 m), weight 180 lb (81.647 kg), SpO2 100.00%. Gen: Alert, well appearing.  Patient is oriented to person, place, time, and situation. CV: RRR, no m/r/g.   LUNGS: CTA bilat, nonlabored resps, good aeration in all lung fields. ABD: soft, NT, ND, BS normal.  No hepatospenomegaly or mass.  No bruits. GU: Genitals normal; both testes normal without tenderness, masses, hydroceles, varicoceles, erythema or swelling. Shaft normal, circumcised, meatus normal without discharge. No inguinal hernia noted. No inguinal lymphadenopathy.   IMPRESSION AND PLAN:  Abdominal wall strain Reassured pt, no sign of hernia. NSAIDs discussed. Rest, heat discussed. Avoid heavy lifting and core exercises until pain is gone.   Flu vaccine IM today.  An After Visit Summary was printed and given to the patient.  FOLLOW UP: prn

## 2013-05-02 NOTE — Assessment & Plan Note (Signed)
Reassured pt, no sign of hernia. NSAIDs discussed. Rest, heat discussed. Avoid heavy lifting and core exercises until pain is gone.

## 2013-05-02 NOTE — Patient Instructions (Signed)
Apply heating pad as needed. Use ibup 600mg  or alleve 400mg  twice a day with food for 10d. Rest.  Avoid heavy lifting.

## 2013-05-02 NOTE — Addendum Note (Signed)
Addended by: Eulah Pont on: 05/02/2013 10:03 AM   Modules accepted: Orders

## 2013-05-08 ENCOUNTER — Other Ambulatory Visit: Payer: Self-pay | Admitting: Family Medicine

## 2013-05-09 NOTE — Telephone Encounter (Signed)
Patient last seen on 05/02/13.  Depakote last prescribed on 02/25/13.   Please advise refill.

## 2013-09-29 ENCOUNTER — Ambulatory Visit: Payer: BC Managed Care – PPO | Admitting: Family Medicine

## 2013-09-29 ENCOUNTER — Ambulatory Visit (INDEPENDENT_AMBULATORY_CARE_PROVIDER_SITE_OTHER): Payer: BC Managed Care – PPO | Admitting: Physician Assistant

## 2013-09-29 ENCOUNTER — Encounter: Payer: Self-pay | Admitting: Physician Assistant

## 2013-09-29 VITALS — BP 110/78 | HR 66 | Temp 98.2°F | Resp 16 | Ht 73.0 in | Wt 184.5 lb

## 2013-09-29 DIAGNOSIS — N529 Male erectile dysfunction, unspecified: Secondary | ICD-10-CM

## 2013-09-29 MED ORDER — TADALAFIL 20 MG PO TABS: 10.0000 mg | ORAL_TABLET | Freq: Every day | ORAL | Status: AC | PRN

## 2013-09-29 NOTE — Assessment & Plan Note (Signed)
Physical exam unremarkable.  Will obtain Lipid Panel, BMP and Testosterone level.  Due to lack of symptoms and no family hx of prostate cancer, will hold off on PSA for now.  Voucher and prescription given for Cialis 20 mg tablet.  Patient to take 1/2 tablet 30 minutes to 1 hour prior to desired intercourse. Discussed side effect profile of medication with patient and when to stop medication if necessary. Will call with lab results and will treat any abnormal findings accordingly.  Further Rx will need to come from patient's PCP.

## 2013-09-29 NOTE — Progress Notes (Signed)
Pre visit review using our clinic review tool, if applicable. No additional management support is needed unless otherwise documented below in the visit note/SLS  

## 2013-09-29 NOTE — Progress Notes (Signed)
Patient presents to clinic today c/o erectile dysfunction that has been gradual but worsening over the past few months.  Denies change in diet.  Denies stress, anxiety or depression.  Endorses difficulty both achieving and maintaining an erection sufficient enough for penetration.  Denies hx of hypertension, hyperlipidemia, diabetes, contractures.  Denies abnormal curvature of penis with erection.  Denies urinary urgency, frequency, hesitancy or nocturia.  Denies hx or symptom of low testosterone.  Has never taken medication for ED.  Denies chest pain or hx of CVD.  Is not taking nitrates.  Past Medical History  Diagnosis Date  . Seizure disorder, primary generalized 08/13/2011    Seizure-free since 1993 while on depakote (trial of ween off depakote about 2000--sleep deprived portion of EEG still abnormal on lower dose so original dose resumed).  No hx of depakote side effect or toxicity.  . Episodic low back pain     Usually resolves in a week or so with flexeril, stretches, NSAIDs, relative rest  . History of dislocation of shoulder x 2    Left; first time was from his 1st seizure, another time more recently was from falling onto shoulder while running--went through PT and is fine now.  . Lumbar degenerative disc disease 08/2012    Referred to neurosurgeon for back pain with radiculopathy    Current Outpatient Prescriptions on File Prior to Visit  Medication Sig Dispense Refill  . cyclobenzaprine (FLEXERIL) 10 MG tablet 1 tab po tid prn low back pain  30 tablet  3  . divalproex (DEPAKOTE ER) 500 MG 24 hr tablet TAKE 2 TABLETS (1000 MG) DAILY  180 tablet  2  . Multiple Vitamin (MULTIVITAMIN) tablet Take 1 tablet by mouth daily.       No current facility-administered medications on file prior to visit.    Allergies  Allergen Reactions  . Sulfa Antibiotics     Infant, unknown reaction    Family History  Problem Relation Age of Onset  . Arthritis Mother   . Arthritis Father   . Seizures  Brother   . Heart disease Maternal Grandfather     died of MI  . Heart disease Paternal Grandmother     died of MI    History   Social History  . Marital Status: Married    Spouse Name: N/A    Number of Children: N/A  . Years of Education: N/A   Social History Main Topics  . Smoking status: Never Smoker   . Smokeless tobacco: Never Used  . Alcohol Use: 3.0 - 6.0 oz/week    5-10 Glasses of wine per week  . Drug Use: No  . Sexual Activity: None   Other Topics Concern  . None   Social History Narrative   Married, 2 children (ages 337 and 10 yrs).   [place].   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   College--[employer].   Enjoys woodworking and exercise.   No T/A/Ds.   Review of Systems - See HPI.  All other ROS are negative.  BP 110/78  Pulse 66  Temp(Src) 98.2 F (36.8 C) (Oral)  Resp 16  Ht 6\' 1"  (1.854 m)  Wt 184 lb 8 oz (83.689 kg)  BMI 24.35 kg/m2  SpO2 98%  Physical Exam  Constitutional: He is oriented to person, place, and time and well-developed, well-nourished, and in no distress.  HENT:  Head: Normocephalic and atraumatic.  Eyes: Conjunctivae  are normal.  Cardiovascular: Normal rate, regular rhythm and normal heart sounds.   Pulmonary/Chest: Effort normal and breath sounds normal. No respiratory distress. He has no wheezes. He has no rales. He exhibits no tenderness.  Genitourinary: Testes/scrotum normal and penis normal. He exhibits no abnormal testicular mass and no testicular tenderness. Penis exhibits no lesions. No discharge found.  No evidence of fibrous plaque or other anatomical abnormality on examination.  Neurological: He is alert and oriented to person, place, and time.  Skin: Skin is warm and dry. No rash noted.  Psychiatric: Affect normal.   Assessment/Plan: Erectile dysfunction Physical exam unremarkable.  Will obtain Lipid Panel, BMP and Testosterone level.  Due  to lack of symptoms and no family hx of prostate cancer, will hold off on PSA for now.  Voucher and prescription given for Cialis 20 mg tablet.  Patient to take 1/2 tablet 30 minutes to 1 hour prior to desired intercourse. Discussed side effect profile of medication with patient and when to stop medication if necessary. Will call with lab results and will treat any abnormal findings accordingly.  Further Rx will need to come from patient's PCP.

## 2013-09-29 NOTE — Patient Instructions (Signed)
Please take 1/2 tablet as needed 1 hour prior to desired sexual intercourse.  IF you develop lightheadedness, dizziness or headache, please let us know. I will call you with your lab results.  We will treat if anything comes back abnormal.  Follow-up will be based on lab results.

## 2013-09-30 LAB — LIPID PANEL
CHOL/HDL RATIO: 2.6 ratio
Cholesterol: 167 mg/dL (ref 0–200)
HDL: 65 mg/dL (ref >39)
LDL CALC: 84 mg/dL (ref 0–99)
TRIGLYCERIDES: 89 mg/dL (ref <150)
VLDL: 18 mg/dL (ref 0–40)

## 2013-09-30 LAB — BASIC METABOLIC PANEL
BUN: 16 mg/dL (ref 6–23)
CHLORIDE: 100 meq/L (ref 96–112)
CO2: 32 mEq/L (ref 19–32)
Calcium: 9.8 mg/dL (ref 8.4–10.5)
Creat: 0.96 mg/dL (ref 0.50–1.35)
Glucose, Bld: 98 mg/dL (ref 70–99)
Potassium: 4.5 mEq/L (ref 3.5–5.3)
Sodium: 138 mEq/L (ref 135–145)

## 2013-09-30 LAB — TESTOSTERONE, FREE, TOTAL, SHBG
Sex Hormone Binding: 42 nmol/L (ref 13–71)
Testosterone, Free: 33.2 pg/mL — ABNORMAL LOW (ref 47.0–244.0)
Testosterone-% Free: 1.6 % (ref 1.6–2.9)
Testosterone: 203 ng/dL — ABNORMAL LOW (ref 300–890)

## 2013-10-04 ENCOUNTER — Encounter: Payer: Self-pay | Admitting: Family Medicine

## 2013-10-12 ENCOUNTER — Encounter: Payer: Self-pay | Admitting: Family Medicine

## 2013-10-12 ENCOUNTER — Ambulatory Visit (INDEPENDENT_AMBULATORY_CARE_PROVIDER_SITE_OTHER): Payer: BC Managed Care – PPO | Admitting: Family Medicine

## 2013-10-12 VITALS — BP 139/87 | HR 67 | Temp 98.4°F | Ht 73.0 in | Wt 184.2 lb

## 2013-10-12 DIAGNOSIS — E039 Hypothyroidism, unspecified: Secondary | ICD-10-CM

## 2013-10-12 DIAGNOSIS — R7989 Other specified abnormal findings of blood chemistry: Secondary | ICD-10-CM

## 2013-10-12 DIAGNOSIS — N529 Male erectile dysfunction, unspecified: Secondary | ICD-10-CM

## 2013-10-12 DIAGNOSIS — E291 Testicular hypofunction: Secondary | ICD-10-CM

## 2013-10-12 LAB — FOLLICLE STIMULATING HORMONE: FSH: 3.7 m[IU]/mL (ref 1.4–18.1)

## 2013-10-12 LAB — CORTISOL: Cortisol, Plasma: 10.7 ug/dL

## 2013-10-12 LAB — TSH: TSH: 4.33 u[IU]/mL (ref 0.35–4.50)

## 2013-10-12 LAB — T3: T3 TOTAL: 72.4 ng/dL — AB (ref 80.0–204.0)

## 2013-10-12 LAB — T4, FREE: Free T4: 0.76 ng/dL (ref 0.60–1.60)

## 2013-10-12 LAB — LUTEINIZING HORMONE: LH: 2.17 m[IU]/mL (ref 1.50–9.30)

## 2013-10-12 NOTE — Progress Notes (Signed)
Pre visit review using our clinic review tool, if applicable. No additional management support is needed unless otherwise documented below in the visit note. 

## 2013-10-12 NOTE — Progress Notes (Signed)
OFFICE NOTE  10/12/2013  CC:  Chief Complaint  Patient presents with  . Results    HPI: Patient is a 48 y.o. Caucasian male who is here for discussion of recent lab results done 09/29/13 (two weeks ago) for erectile dysfunction.  He seemed to think his libido is not an issue.   FLP and BMET were normal.  Testost total was 203, free testost 33.2 (both low).  He was given samples of cialis but has not taken this yet.  He is concerned that possibly hypothyroidism is causing his low testost: he says he has cold intolerance, some muscle aches, feels like it is harder to lose weight lately. No visual field problems, no signif HA's.     Pertinent PMH:  Past medical, surgical, social, and family history reviewed and no changes are noted since last office visit.  MEDS:  Outpatient Prescriptions Prior to Visit  Medication Sig Dispense Refill  . cyclobenzaprine (FLEXERIL) 10 MG tablet 1 tab po tid prn low back pain  30 tablet  3  . divalproex (DEPAKOTE ER) 500 MG 24 hr tablet TAKE 2 TABLETS (1000 MG) DAILY  180 tablet  2  . Multiple Vitamin (MULTIVITAMIN) tablet Take 1 tablet by mouth daily.      . tadalafil (CIALIS) 20 MG tablet Take 0.5-1 tablets (10-20 mg total) by mouth daily as needed for erectile dysfunction.  3 tablet  0   No facility-administered medications prior to visit.    PE: Blood pressure 139/87, pulse 67, temperature 98.4 F (36.9 C), temperature source Temporal, height 6\' 1"  (1.854 m), weight 184 lb 4 oz (83.575 kg), SpO2 100.00%. Gen: Alert, well appearing.  Patient is oriented to person, place, time, and situation. Neck - No masses or thyromegaly or limitation in range of motion. CV: RRR, no m/r/g.   LUNGS: CTA bilat, nonlabored resps, good aeration in all lung fields. EXT: no clubbing, cyanosis, or edema.   LABS 09/29/13: Total testosterone 203, sex hormone binding globulin 42, free testosterone 33.2, %free testosterone 1.6.  IMPRESSION AND PLAN:  Low serum  testosterone level Repeat testosterone, total and free. Will also check TSH, free 4 and T3 total. Prolactin, cortisol, LH, and FSH.  An After Visit Summary was printed and given to the patient.  FOLLOW UP:  To be determined based on results of pending workup.

## 2013-10-13 DIAGNOSIS — R7989 Other specified abnormal findings of blood chemistry: Secondary | ICD-10-CM | POA: Insufficient documentation

## 2013-10-13 LAB — PROLACTIN: Prolactin: 5 ng/mL (ref 2.1–17.1)

## 2013-10-13 LAB — TESTOSTERONE, FREE, TOTAL, SHBG
SEX HORMONE BINDING: 40 nmol/L (ref 13–71)
TESTOSTERONE FREE: 45.7 pg/mL — AB (ref 47.0–244.0)
TESTOSTERONE-% FREE: 1.7 % (ref 1.6–2.9)
Testosterone: 265 ng/dL — ABNORMAL LOW (ref 300–890)

## 2013-10-13 NOTE — Assessment & Plan Note (Signed)
Repeat testosterone, total and free. Will also check TSH, free 4 and T3 total. Prolactin, cortisol, LH, and FSH.

## 2013-10-14 ENCOUNTER — Telehealth: Payer: Self-pay | Admitting: Family Medicine

## 2013-10-14 NOTE — Telephone Encounter (Signed)
Nicholas Sellers called patient back.

## 2013-10-14 NOTE — Telephone Encounter (Signed)
Patient is returning your call. Please call him.

## 2013-10-21 ENCOUNTER — Ambulatory Visit (INDEPENDENT_AMBULATORY_CARE_PROVIDER_SITE_OTHER): Payer: BC Managed Care – PPO | Admitting: Internal Medicine

## 2013-10-21 ENCOUNTER — Encounter: Payer: Self-pay | Admitting: Internal Medicine

## 2013-10-21 VITALS — BP 112/78 | HR 69 | Temp 98.3°F | Resp 12 | Ht 73.0 in | Wt 186.6 lb

## 2013-10-21 DIAGNOSIS — R7989 Other specified abnormal findings of blood chemistry: Secondary | ICD-10-CM

## 2013-10-21 DIAGNOSIS — E291 Testicular hypofunction: Secondary | ICD-10-CM

## 2013-10-21 NOTE — Progress Notes (Addendum)
Patient ID: Nicholas Sellers, male   DOB: [birth year], 48 y.o.   MRN: [id number]  HPI: Nicholas Sellers is a 48 y.o.-year-old man, referred by his PCP, Dr.McGowen, in consultation for low testosterone.  Pt went to see PCP for ED (going on for years, but worse lately) >> Testosterone checked - low; started Cialis >> PCP rechecked testosterone and this was low again. Other pituitary tests were checked and were normal at that time.   I reviewed labs obtained by PCP: Component     Latest Ref Rng 09/29/2013 10/12/2013  Testosterone     300 - 890 ng/dL 829203 (L) [id number] (L)  Sex Hormone Binding     13 - 71 nmol/L 42 40  Testosterone Free     47.0 - 244.0 pg/mL 33.2 (L) 45.7 (L)  Testosterone-% Free     1.6 - 2.9 % 1.6 1.7  TSH     0.35 - 4.50 uIU/mL  4.33  T3, Total     80.0 - 204.0 ng/dL  13.072.4 (L)  Free T4     0.60 - 1.60 ng/dL  8.650.76  Cortisol, Plasma       10.7  LH     1.50 - 9.30 mIU/mL  2.17  FSH     1.4 - 18.1 mIU/ML  3.7  Prolactin     2.1 - 17.1 ng/mL  5.0  The above labs were drawn at ~10:40 am and ~9:40 am respectively. Pt was not fasting.   He denies decreased libido Has difficulty obtaining and maintaining an erection - at least for 3-4 year, but more everyday now No trauma to testes, testicular irradiation; but had vasectomy in 2006 No h/o of mumps orchitis/h/o autoimmune ds. No h/o cryptorchidism He grew and went through puberty like his peers No shrinking of testes. No very small testes (<5 ml) No incomplete/delayed sexual development     No breast discomfort/gynecomastia    No loss of body hair (axillary/pubic)/decreased need for shaving No height loss No abnormal sense of smell  No hot flushes No vision problems - wears contacts No worst HA of his life No FH of hypogonadism/infertility  No personal h/o infertility - has 2 children: 47 and 509 y/o  No FH of hemochromatosis or pituitary tumors No excessive weight gain (gained 9-10 lbs 2 months) or loss.  No chronic diseases other  than seizures - controlled  - no seizures for 20 years + lower back chronic pain. 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 He does not have family history of early cardiac disease.  Per review of the chart, he has a h/o Prostatitis >> tx with ABx.  ROS: see HPI+ Constitutional: + weight gain (see HPI), + fatigue, + subjective hypothermia Eyes: no blurry vision, no xerophthalmia ENT: no sore throat, no nodules palpated in throat, no dysphagia/odynophagia, no hoarseness Cardiovascular: no CP/SOB/palpitations/leg swelling Respiratory: no cough/SOB Gastrointestinal: no N/V/D/C Musculoskeletal: no muscle/+ joint aches (low back pain)/+ leg cramps at night Skin: no rashes Neurological: no tremors/numbness/tingling/dizziness Psychiatric: no depression/anxiety, + forgetfulness  Past Medical History  Diagnosis Date  . Seizure disorder, primary generalized 08/13/2011    Seizure-free since 1993 while on depakote (trial of ween off depakote about 2000--sleep deprived  portion of EEG still abnormal on lower dose so original dose resumed).  No hx of depakote side effect or toxicity.  . Episodic low back pain     Usually resolves in a week or so with flexeril, stretches, NSAIDs, relative rest  . History of dislocation of shoulder x 2    Left; first time was from his 1st seizure, another time more recently was from falling onto shoulder while running--went through PT and is fine now.  . Lumbar degenerative disc disease 08/2012    Referred to neurosurgeon for back pain with radiculopathy   History reviewed. No pertinent past surgical history. History   Social History  . Marital Status: Married    Spouse Name: N/A    Number of Children: 2   Occupational History   . Business owner.   Social History Main Topics  . Smoking status: Never Smoker   . Smokeless tobacco: Never Used  . Alcohol Use: 3.0 - 6.0 oz/week    5-10 Glasses of wine per week  . Drug Use: No   Social History Narrative   Married, 2 children (ages 70 and 10 yrs).   Relocated from [place], [place] in 2011.   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   College--[employer].   Enjoys woodworking and exercise.   No T/A/Ds.   Current Outpatient Prescriptions on File Prior to Visit  Medication Sig Dispense Refill  . cyclobenzaprine (FLEXERIL) 10 MG tablet 1 tab po tid prn low back pain  30 tablet  3  . divalproex (DEPAKOTE ER) 500 MG 24 hr tablet TAKE 2 TABLETS (1000 MG) DAILY  180 tablet  2  . Multiple Vitamin (MULTIVITAMIN) tablet Take 1 tablet by mouth daily.      . tadalafil (CIALIS) 20 MG tablet Take 0.5-1 tablets (10-20 mg total) by mouth daily as needed for erectile dysfunction.  3 tablet  0   No current facility-administered medications on file prior to visit.   Allergies  Allergen Reactions  . Sulfa Antibiotics     Infant, unknown reaction   Family History  Problem Relation Age of Onset  . Arthritis Mother   . Thyroid disease Mother   . Arthritis Father   . Seizures Brother   . Heart disease Maternal Grandfather     died of MI  . Heart disease Paternal Grandmother     died of MI   PE: BP 112/78  Pulse 69  Temp(Src) 98.3 F (36.8 C) (Oral)  Resp 12  Ht 6\' 1"  (1.854 m)  Wt 186 lb 9.6 oz (84.641 kg)  BMI 24.62 kg/m2  SpO2 97% Wt Readings from Last 3 Encounters:  10/21/13 186 lb 9.6 oz (84.641 kg)  10/12/13 184 lb 4 oz (83.575 kg)  09/29/13 184 lb 8 oz (83.689 kg)   Constitutional: normal weight, in NAD Eyes: PERRLA, EOMI, no exophthalmos ENT: moist mucous membranes, no thyromegaly, no cervical lymphadenopathy Cardiovascular: RRR, No MRG Respiratory: CTA B Gastrointestinal: abdomen soft, NT, ND,  BS+ Musculoskeletal: no deformities, strength intact in all 4 Skin: moist, warm, no rashes Neurological: + mild tremor with outstretched hands, DTR normal in all 4 Genital exam: normal male escutcheon, no inguinal LAD, normal phallus, testes ~25 mL, no testicular masses, no penile discharge.  No gynecomastia.  ASSESSMENT: 1. Low testosterone  2. ED  PLAN:  1. Low total testosterone I had a long discussion with the pt regarding his testosterone results, which we reviewed together. I  explained that the total testosterone consists of bioavailable Tand bound testosterone.  - The total testosterone can be low: - later in the afternoon  - when the testes do not secrete enough testosterone (in this case the free testosterone is low, too)   - when the SHBG is low (in this case the free testosterone is normal) - we discussed that there is also no evidence that increasing the testosterone within the normal range will help with either erections or his energy. Also, the normal range is not spanning different ages, but it is determined in young men, at 8-9 am, fasting. Therefore, a free T level of 40 is not "the testosterone level of an 80-y/o man", but can be perfectly normal in a young man, too. If a free testosterone is in the normal range, testosterone treatment is not indicated, and might even be harmful as per the latest studies that showed increased cardiac risk with testosterone supplementation. - we discussed about controlling his diet (advised him to cut down fat and meat and increase the use of fruit and vegetables), improve his cholesterol, improve his sleep, exercise regularly - for now we will check: Orders Placed This Encounter  Procedures  . CBC w/Diff  . Testosterone, free, total  . Estradiol  . B-HCG Quant  . Hepatic function panel  . IBC panel  . Insulin-like growth factor  . PSA  - if testosterone low, may need a pituitary MRI and then T replacement or Clomiphene  2. Erectile  dysfunction - if the testosterone returns normal, he may need to see urology to determine the cause for his ED - until then, continue PDE5 inhibitor   Component     Latest Ref Rng 10/25/2013          Testosterone     300 - 890 ng/dL 093  Sex Hormone Binding     13 - 71 nmol/L 36  Testosterone Free     47.0 - 244.0 pg/mL 94.0  Testosterone-% Free     1.6 - 2.9 % 2.0  Estradiol, Free      0.43  Estradiol      19  Results received      11/02/13  Iron     42 - 165 ug/dL 267  Transferrin     124.5 - 360.0 mg/dL 809.9  Saturation Ratios     20.0 - 50.0 % 29.0  hCG, Beta Chain, Quant, S      0.97  Somatomedin (IGF-I)     55 - 213 ng/mL 128  PSA     0.10 - 4.00 ng/mL 0.45  Thyroid Peroxidase Antibody     <35.0 IU/mL <10.0  No signs of pituitary, testicular, or thyroid dysfxn. No sign of hemochromatosis.

## 2013-10-21 NOTE — Patient Instructions (Signed)
Please come back for labs fasting, at 8 am. I will send you the lab results through MyChart. We will schedule a new visit if labs are abnormal.

## 2013-10-25 ENCOUNTER — Other Ambulatory Visit: Payer: Self-pay | Admitting: *Deleted

## 2013-10-25 ENCOUNTER — Other Ambulatory Visit (INDEPENDENT_AMBULATORY_CARE_PROVIDER_SITE_OTHER): Payer: BC Managed Care – PPO

## 2013-10-25 DIAGNOSIS — R7989 Other specified abnormal findings of blood chemistry: Secondary | ICD-10-CM

## 2013-10-25 DIAGNOSIS — E291 Testicular hypofunction: Secondary | ICD-10-CM

## 2013-10-25 LAB — HEPATIC FUNCTION PANEL
ALT: 24 U/L (ref 0–53)
AST: 21 U/L (ref 0–37)
Albumin: 3.8 g/dL (ref 3.5–5.2)
Alkaline Phosphatase: 37 U/L — ABNORMAL LOW (ref 39–117)
BILIRUBIN DIRECT: 0.1 mg/dL (ref 0.0–0.3)
BILIRUBIN TOTAL: 0.7 mg/dL (ref 0.2–1.2)
Total Protein: 6.5 g/dL (ref 6.0–8.3)

## 2013-10-25 LAB — CBC WITH DIFFERENTIAL/PLATELET
BASOS ABS: 0 10*3/uL (ref 0.0–0.1)
Basophils Relative: 0.4 % (ref 0.0–3.0)
EOS ABS: 0 10*3/uL (ref 0.0–0.7)
Eosinophils Relative: 1.2 % (ref 0.0–5.0)
HCT: 43.2 % (ref 39.0–52.0)
Hemoglobin: 14.6 g/dL (ref 13.0–17.0)
LYMPHS PCT: 43.2 % (ref 12.0–46.0)
Lymphs Abs: 1.8 10*3/uL (ref 0.7–4.0)
MCHC: 33.8 g/dL (ref 30.0–36.0)
MCV: 89.4 fl (ref 78.0–100.0)
Monocytes Absolute: 0.3 10*3/uL (ref 0.1–1.0)
Monocytes Relative: 6.2 % (ref 3.0–12.0)
NEUTROS PCT: 49 % (ref 43.0–77.0)
Neutro Abs: 2.1 10*3/uL (ref 1.4–7.7)
PLATELETS: 219 10*3/uL (ref 150.0–400.0)
RBC: 4.83 Mil/uL (ref 4.22–5.81)
RDW: 13.4 % (ref 11.5–15.5)
WBC: 4.3 10*3/uL (ref 4.0–10.5)

## 2013-10-25 LAB — IBC PANEL
Iron: 108 ug/dL (ref 42–165)
Saturation Ratios: 29 % (ref 20.0–50.0)
TRANSFERRIN: 265.9 mg/dL (ref 212.0–360.0)

## 2013-10-25 LAB — PSA: PSA: 0.45 ng/mL (ref 0.10–4.00)

## 2013-10-25 LAB — HCG, QUANTITATIVE, PREGNANCY: hCG, Beta Chain, Quant, S: 0.97 m[IU]/mL

## 2013-10-26 LAB — THYROID PEROXIDASE ANTIBODY

## 2013-10-26 LAB — TESTOSTERONE, FREE, TOTAL, SHBG
Sex Hormone Binding: 36 nmol/L (ref 13–71)
Testosterone, Free: 94 pg/mL (ref 47.0–244.0)
Testosterone-% Free: 2 % (ref 1.6–2.9)
Testosterone: 476 ng/dL (ref 300–890)

## 2013-10-26 LAB — INSULIN-LIKE GROWTH FACTOR: Somatomedin (IGF-I): 128 ng/mL (ref 55–213)

## 2013-10-31 ENCOUNTER — Encounter: Payer: Self-pay | Admitting: Internal Medicine

## 2013-11-02 LAB — ESTRADIOL, FREE
Estradiol, Free: 0.43 pg/mL
Estradiol: 19 pg/mL

## 2013-11-03 ENCOUNTER — Encounter: Payer: Self-pay | Admitting: Internal Medicine

## 2013-11-06 ENCOUNTER — Encounter: Payer: Self-pay | Admitting: Family Medicine

## 2013-11-24 ENCOUNTER — Telehealth: Payer: Self-pay | Admitting: Family Medicine

## 2013-11-24 NOTE — Telephone Encounter (Signed)
Pt requesting RF of depakote.  Last OV was 10/12/13.  Last RX was 05/08/14 # 180 x 2 RFs.  Please advise.

## 2013-11-24 NOTE — Telephone Encounter (Signed)
OK to RF as previously prescribed plus 3 additional RFs.-thx

## 2013-11-25 MED ORDER — DIVALPROEX SODIUM ER 500 MG PO TB24: ORAL_TABLET | ORAL | Status: DC

## 2013-11-25 NOTE — Telephone Encounter (Signed)
[facility]

## 2013-12-16 ENCOUNTER — Telehealth: Payer: Self-pay | Admitting: Family Medicine

## 2013-12-16 ENCOUNTER — Other Ambulatory Visit: Payer: Self-pay

## 2013-12-16 MED ORDER — DIVALPROEX SODIUM ER 500 MG PO TB24: ORAL_TABLET | ORAL | Status: DC

## 2013-12-16 MED ORDER — DIVALPROEX SODIUM ER 500 MG PO TB24: ORAL_TABLET | ORAL | Status: AC

## 2013-12-16 NOTE — Telephone Encounter (Signed)
Please send Rx to the mail order company. Patient has contacted the mail order pharmacy however they said it would take 3 days to contact us for refill. Patient is out of medication.

## 2013-12-16 NOTE — Telephone Encounter (Signed)
Please send to mail order pharmacy. ?

## 2013-12-16 NOTE — Telephone Encounter (Signed)
[facility] requs[facility] June. He will have to have the pharmacy get the transfer.

## 2013-12-16 NOTE — Telephone Encounter (Signed)
[facility]

## 2014-01-10 ENCOUNTER — Ambulatory Visit: Payer: BC Managed Care – PPO | Admitting: Endocrinology

## 2014-02-02 ENCOUNTER — Telehealth: Payer: Self-pay | Admitting: Family Medicine

## 2014-02-02 NOTE — Telephone Encounter (Signed)
I recommend Dr. Elias Else at [facility] ([address]-thx

## 2014-02-02 NOTE — Telephone Encounter (Signed)
Patient aware.

## 2014-02-02 NOTE — Telephone Encounter (Signed)
Patient has moved to the [facility] area. He would like to find a male PCP closer to his house. Patient has called [facility], was told there are no male PCP's accepting patient's. Patient is okay with going out of the network if needed.

## 2014-02-02 NOTE — Telephone Encounter (Signed)
Please advise 

## 2017-08-11 DIAGNOSIS — F4325 Adjustment disorder with mixed disturbance of emotions and conduct: Secondary | ICD-10-CM | POA: Diagnosis not present

## 2017-08-19 DIAGNOSIS — F4325 Adjustment disorder with mixed disturbance of emotions and conduct: Secondary | ICD-10-CM | POA: Diagnosis not present

## 2017-08-24 DIAGNOSIS — F4325 Adjustment disorder with mixed disturbance of emotions and conduct: Secondary | ICD-10-CM | POA: Diagnosis not present

## 2017-08-31 DIAGNOSIS — F4325 Adjustment disorder with mixed disturbance of emotions and conduct: Secondary | ICD-10-CM | POA: Diagnosis not present

## 2017-09-16 DIAGNOSIS — J019 Acute sinusitis, unspecified: Secondary | ICD-10-CM | POA: Diagnosis not present

## 2017-09-16 DIAGNOSIS — R05 Cough: Secondary | ICD-10-CM | POA: Diagnosis not present

## 2017-12-29 DIAGNOSIS — R946 Abnormal results of thyroid function studies: Secondary | ICD-10-CM | POA: Diagnosis not present

## 2017-12-29 DIAGNOSIS — R7989 Other specified abnormal findings of blood chemistry: Secondary | ICD-10-CM | POA: Diagnosis not present

## 2017-12-29 DIAGNOSIS — R5383 Other fatigue: Secondary | ICD-10-CM | POA: Diagnosis not present

## 2018-01-11 DIAGNOSIS — R7989 Other specified abnormal findings of blood chemistry: Secondary | ICD-10-CM | POA: Diagnosis not present

## 2018-01-11 DIAGNOSIS — R6882 Decreased libido: Secondary | ICD-10-CM | POA: Diagnosis not present

## 2018-01-11 DIAGNOSIS — E039 Hypothyroidism, unspecified: Secondary | ICD-10-CM | POA: Diagnosis not present

## 2018-01-11 DIAGNOSIS — R5383 Other fatigue: Secondary | ICD-10-CM | POA: Diagnosis not present

## 2018-02-20 DIAGNOSIS — R1084 Generalized abdominal pain: Secondary | ICD-10-CM | POA: Diagnosis not present

## 2018-02-20 DIAGNOSIS — K5732 Diverticulitis of large intestine without perforation or abscess without bleeding: Secondary | ICD-10-CM | POA: Diagnosis not present

## 2018-05-04 DIAGNOSIS — R030 Elevated blood-pressure reading, without diagnosis of hypertension: Secondary | ICD-10-CM | POA: Diagnosis not present

## 2018-05-04 DIAGNOSIS — Z125 Encounter for screening for malignant neoplasm of prostate: Secondary | ICD-10-CM | POA: Diagnosis not present

## 2018-05-04 DIAGNOSIS — Z1211 Encounter for screening for malignant neoplasm of colon: Secondary | ICD-10-CM | POA: Diagnosis not present

## 2018-05-04 DIAGNOSIS — M545 Low back pain: Secondary | ICD-10-CM | POA: Diagnosis not present

## 2018-05-04 DIAGNOSIS — G40909 Epilepsy, unspecified, not intractable, without status epilepticus: Secondary | ICD-10-CM | POA: Diagnosis not present

## 2018-05-21 DIAGNOSIS — G40909 Epilepsy, unspecified, not intractable, without status epilepticus: Secondary | ICD-10-CM | POA: Diagnosis not present

## 2018-12-21 ENCOUNTER — Other Ambulatory Visit: Payer: Self-pay | Admitting: Family Medicine

## 2018-12-21 ENCOUNTER — Ambulatory Visit
Admission: RE | Admit: 2018-12-21 | Discharge: 2018-12-21 | Disposition: A | Discharge status: Home / Self Care | Payer: 59 | Source: Ambulatory Visit | Attending: Family Medicine | Admitting: Family Medicine

## 2018-12-21 DIAGNOSIS — G6289 Other specified polyneuropathies: Secondary | ICD-10-CM

## 2018-12-21 DIAGNOSIS — M545 Low back pain, unspecified: Secondary | ICD-10-CM

## 2019-01-07 ENCOUNTER — Other Ambulatory Visit: Payer: Self-pay | Admitting: Family Medicine

## 2019-01-07 DIAGNOSIS — M545 Low back pain, unspecified: Secondary | ICD-10-CM

## 2019-01-07 DIAGNOSIS — G629 Polyneuropathy, unspecified: Secondary | ICD-10-CM

## 2019-01-10 ENCOUNTER — Other Ambulatory Visit: Payer: 59

## 2019-02-20 ENCOUNTER — Ambulatory Visit
Admission: RE | Admit: 2019-02-20 | Discharge: 2019-02-20 | Disposition: A | Discharge status: Home / Self Care | Payer: 59 | Source: Ambulatory Visit | Attending: Family Medicine | Admitting: Family Medicine

## 2019-02-20 ENCOUNTER — Other Ambulatory Visit: Payer: Self-pay

## 2019-02-20 DIAGNOSIS — G629 Polyneuropathy, unspecified: Secondary | ICD-10-CM

## 2019-02-20 DIAGNOSIS — M545 Low back pain, unspecified: Secondary | ICD-10-CM

## 2019-02-20 MED ORDER — GADOBENATE DIMEGLUMINE 529 MG/ML IV SOLN
18.0000 mL | Freq: Once | INTRAVENOUS | Status: AC | PRN
  Administered 2019-02-20: 18 mL via INTRAVENOUS

## 2019-07-26 IMAGING — CR LUMBAR SPINE - 2-3 VIEW
3 series · 3 of 3 positions shown · non-contrast
Comparison: MRI of 09/14/2012.

CLINICAL DATA: Back pain.

EXAM:
LUMBAR SPINE - 2-3 VIEW

[t lumbar spine ap]
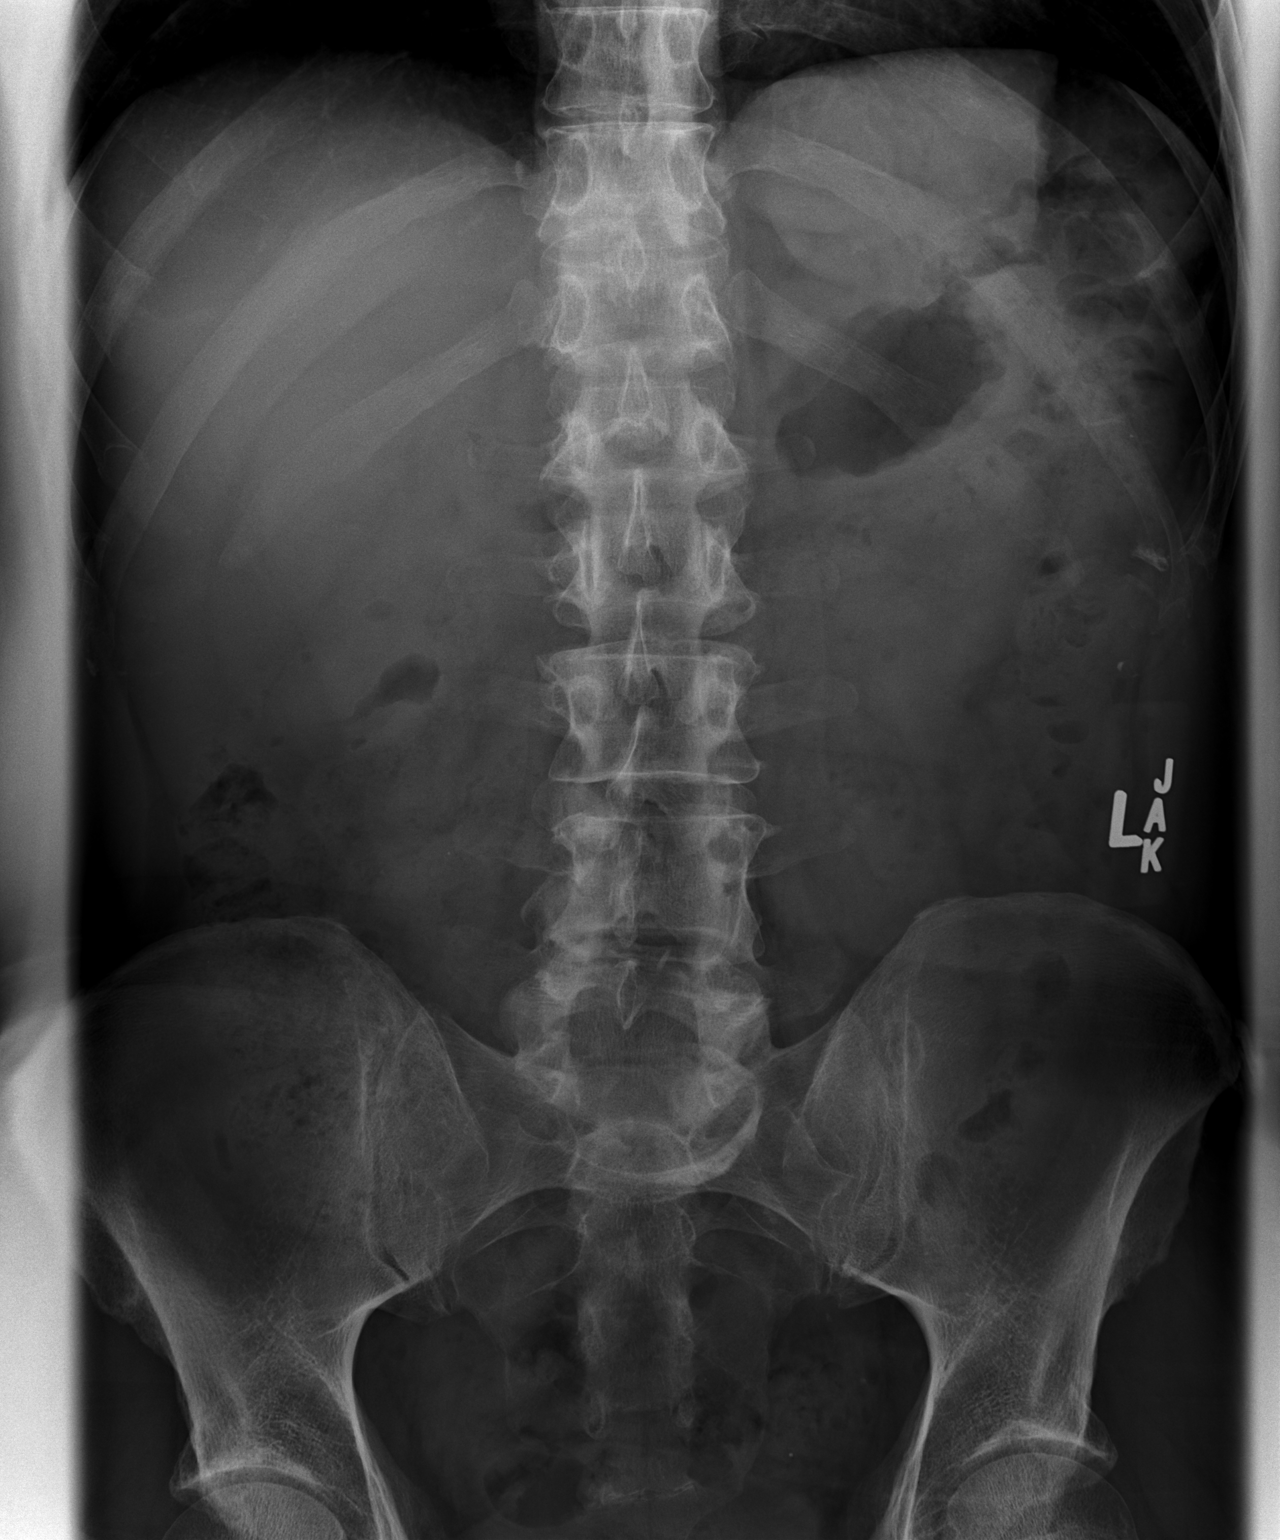

[t lumbar spine lat]
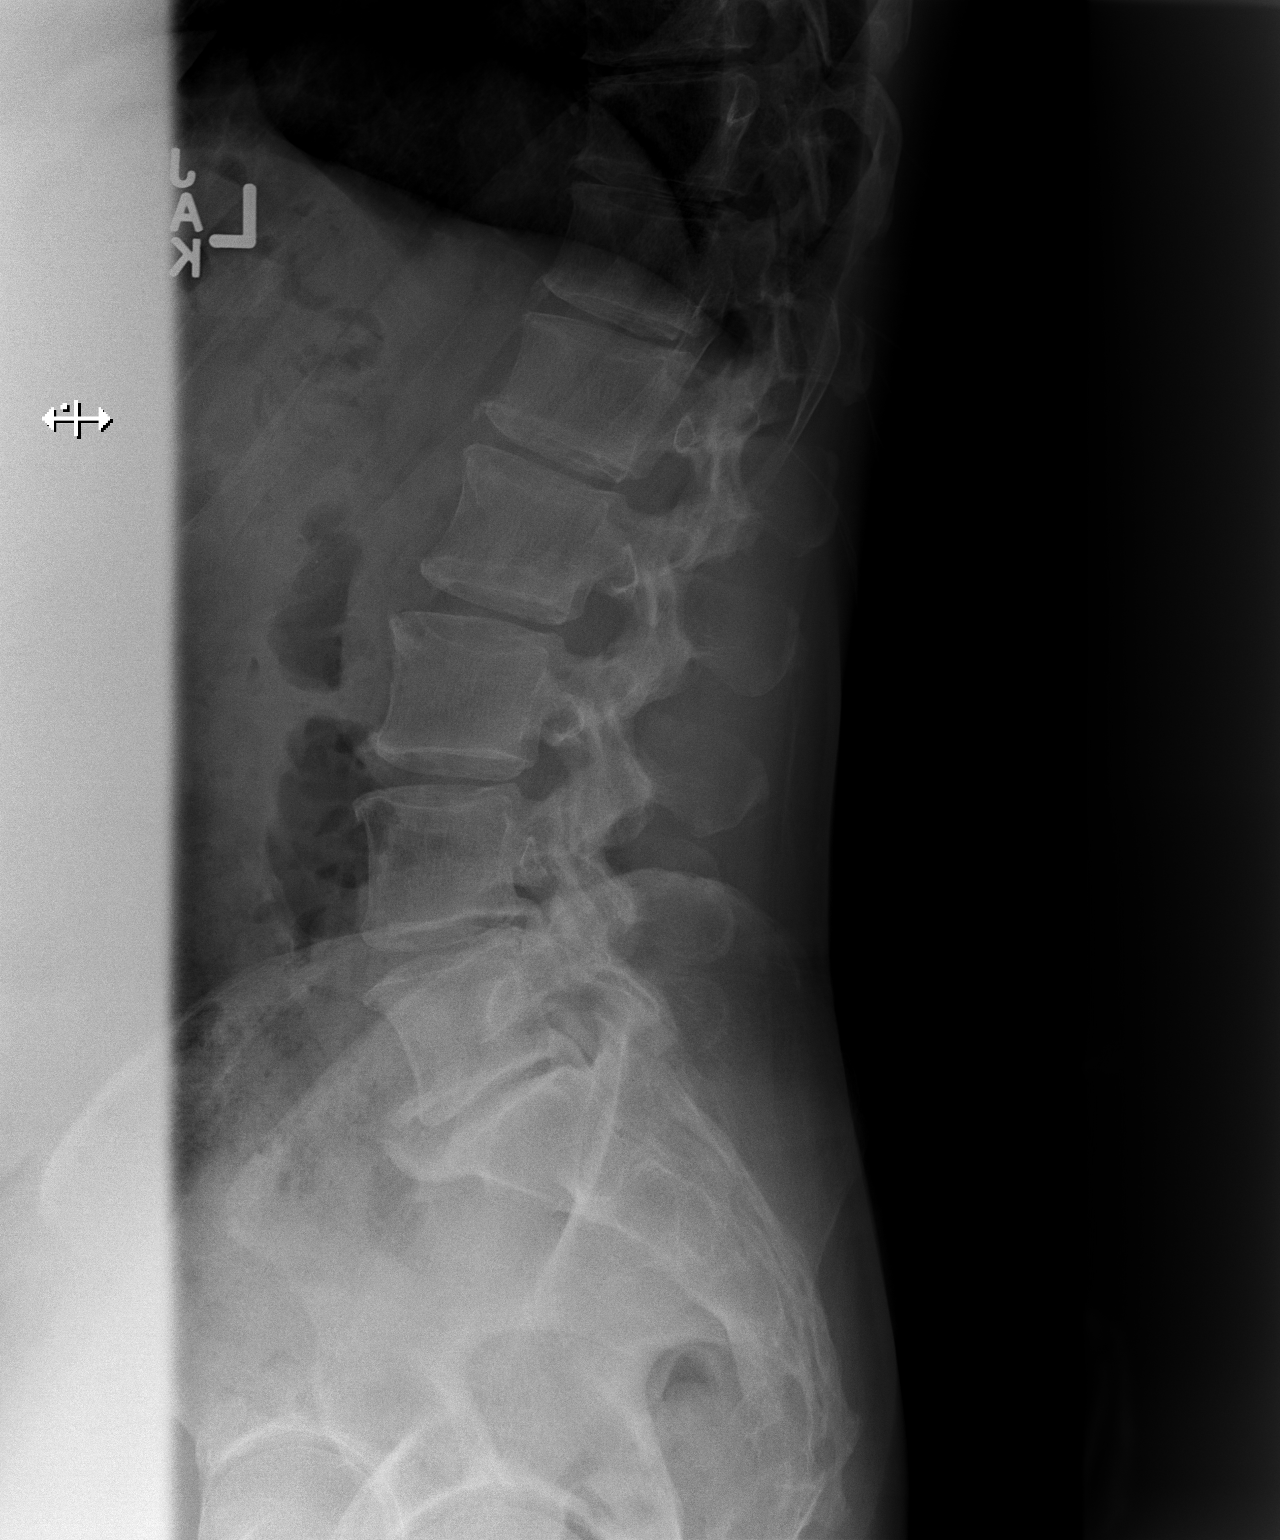

[t lumbar l-5 s-1 spot]
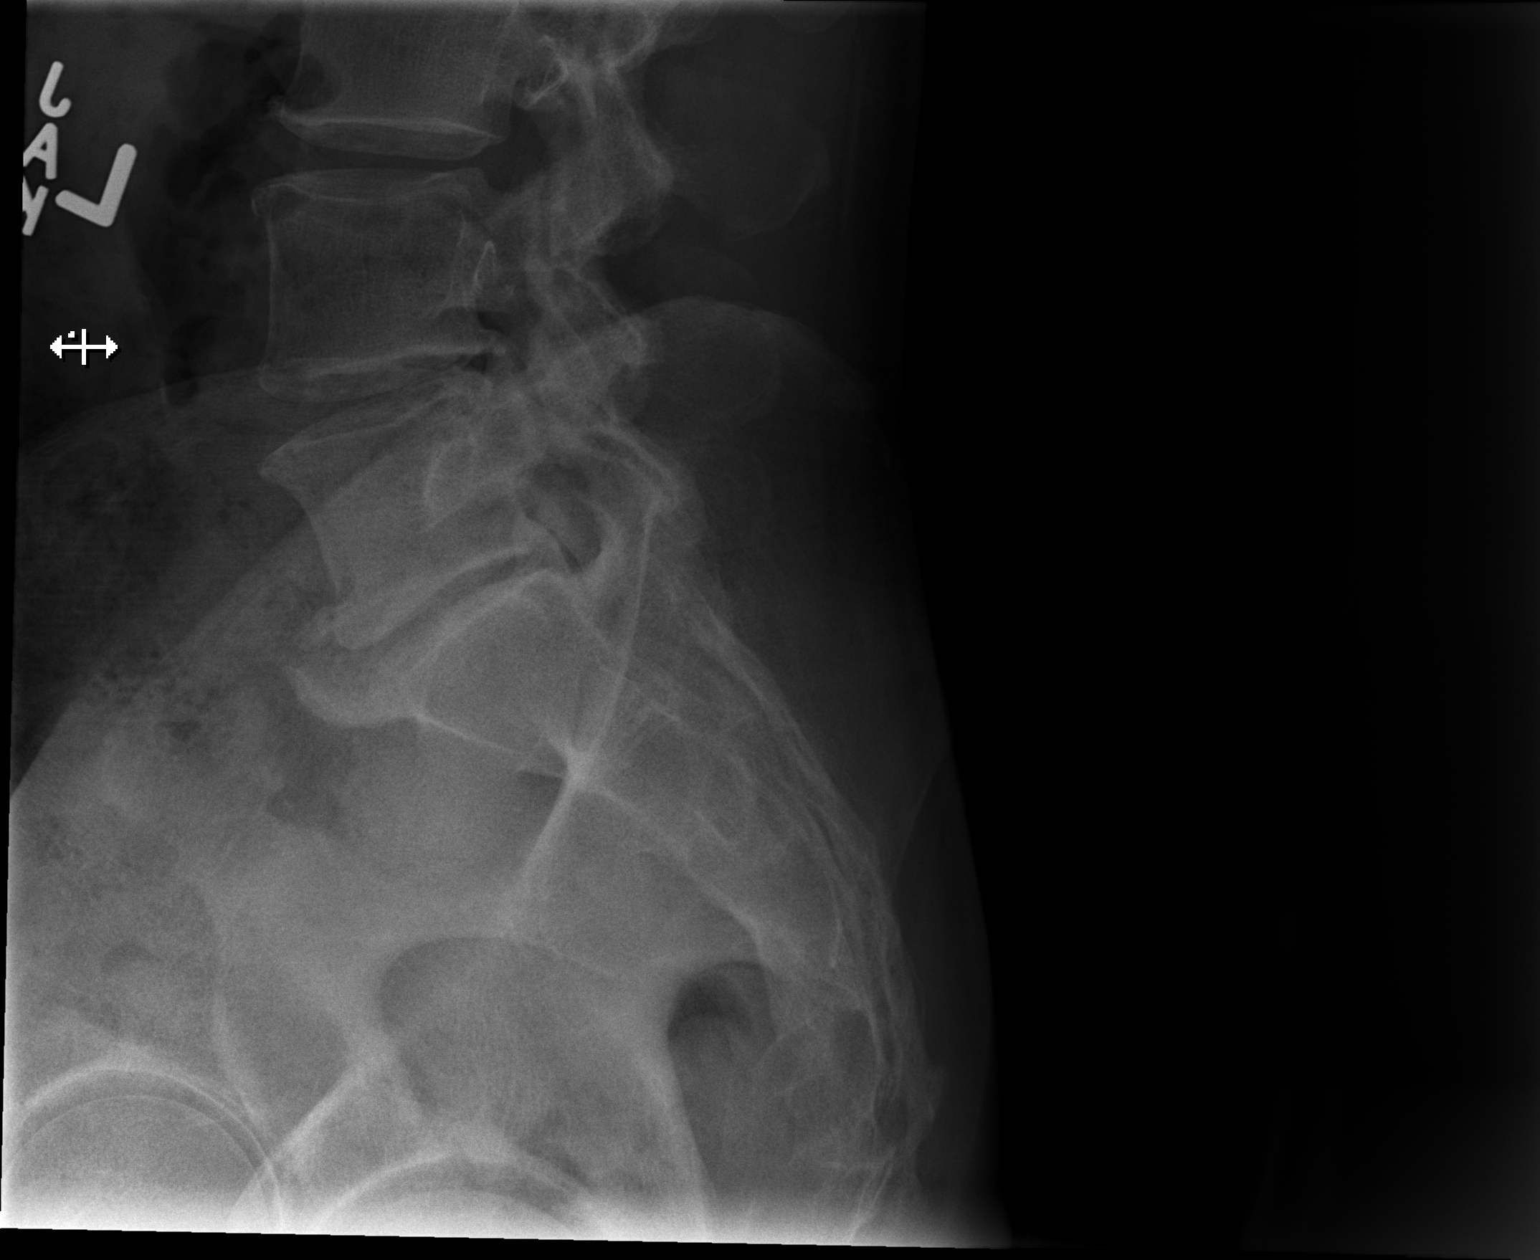

[3 of 3 positions shown; findings below may reference images not displayed]

FINDINGS: Mild lumbar scoliosis concave right. Diffuse multilevel degenerative
change. No acute bony abnormality. No evidence of fracture.
IMPRESSION: Mild lumbar scoliosis concave right. Diffuse multilevel degenerative
change. No acute bony abnormality.

## 2019-09-25 IMAGING — MR MR LUMBAR SPINE WO/W CM
4 of 8 series · 21 of 48 positions shown · IV contrast (multihance)
Comparison: Lumbar spine radiograph 12/21/2018

CLINICAL DATA: Nine month history of low back pain. Peripheral
nerve disorder JGM.D (ZEX-ZV-CM). Low back pain, unspecified back
pain laterality, unspecified chronicity, unspecified whether
sciatica present D81.8 (ZEX-ZV-CM)

EXAM:
MRI LUMBAR SPINE WITHOUT AND WITH CONTRAST
TECHNIQUE: Multiplanar and multiecho pulse sequences of the lumbar spine were
obtained without and with intravenous contrast.
CONTRAST:  18mL MULTIHANCE GADOBENATE DIMEGLUMINE 529 MG/ML IV SOLN

[Series 6: T1 · sagittal · 4.0mm · 0.88mm/px · 4 of 16 slices shown (1 of 2)]
[im 1/16]
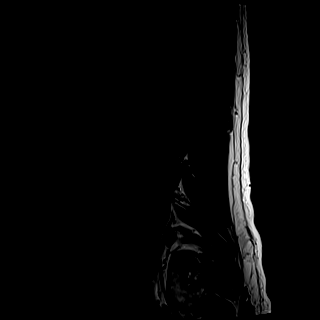
[im 6/16]
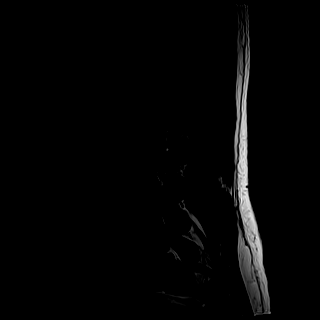
[im 11/16]
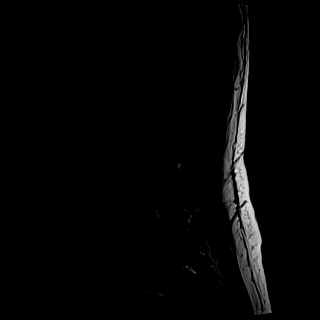
[im 16/16]
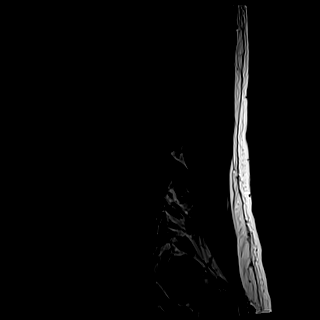

[Series 8: T1 · axial · 4.0mm · 0.35mm/px · z∈[-33,+76]mm · 5 of 28 slices shown (2 of 2)]
[im 1/28]
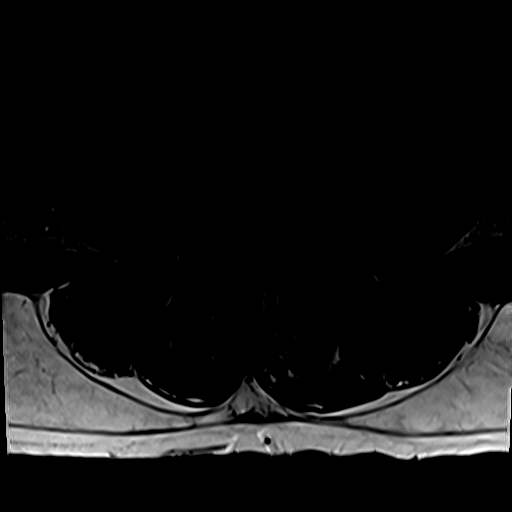
[im 5/28]
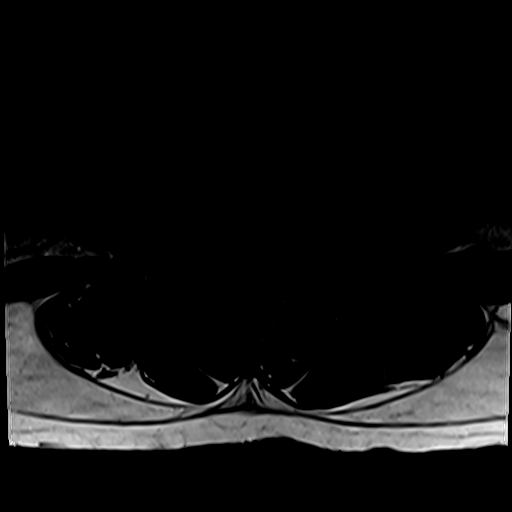
[im 10/28]
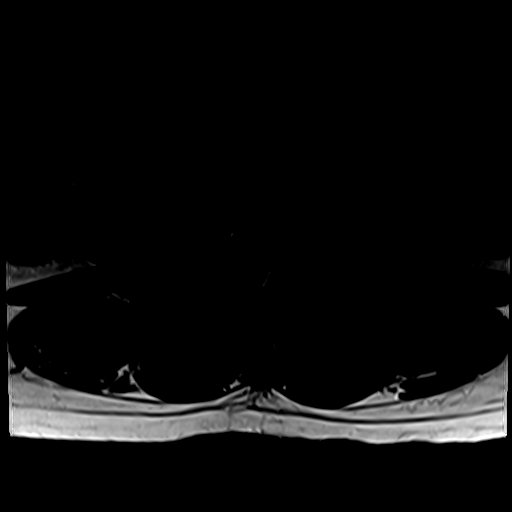
[im 14/28]
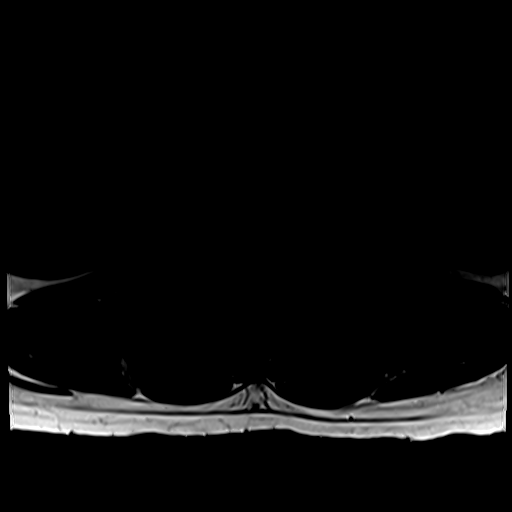
[im 23/28]
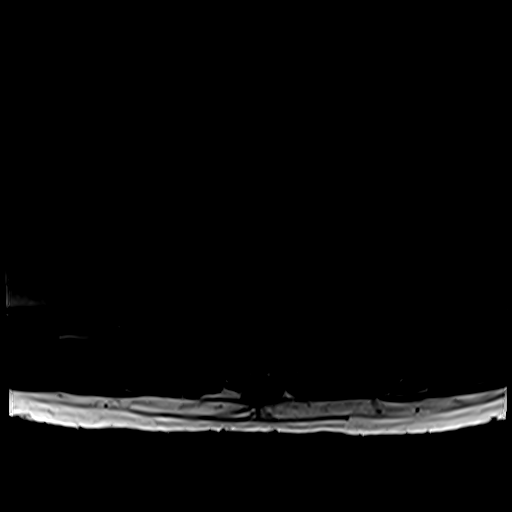

[Series 13: T2 · axial · 4.0mm · 0.28mm/px · z∈[-133,+100]mm · 9 of 43 slices shown (1 of 2)]
[im 1/43]
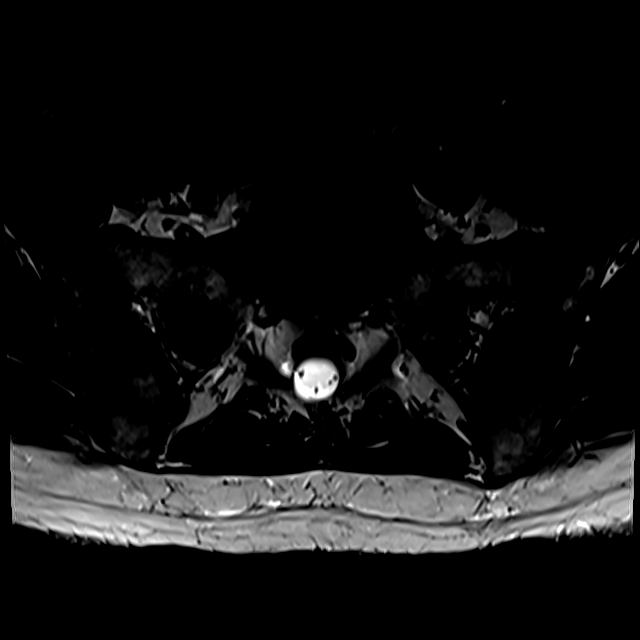
[im 6/43]
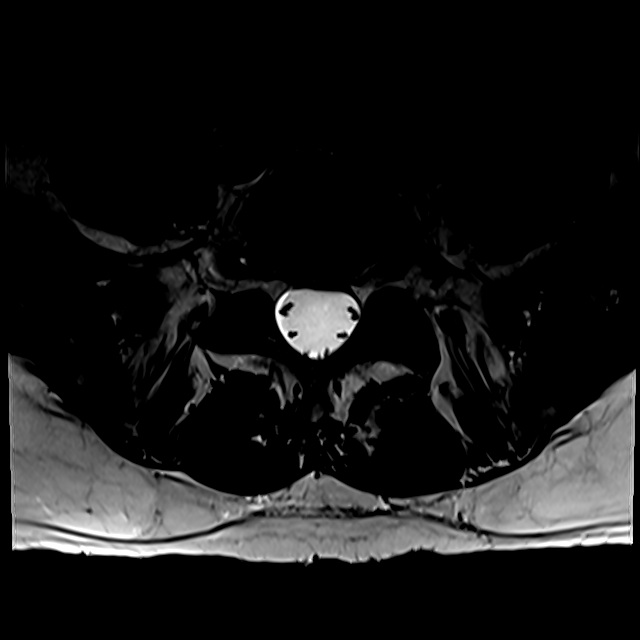
[im 11/43]
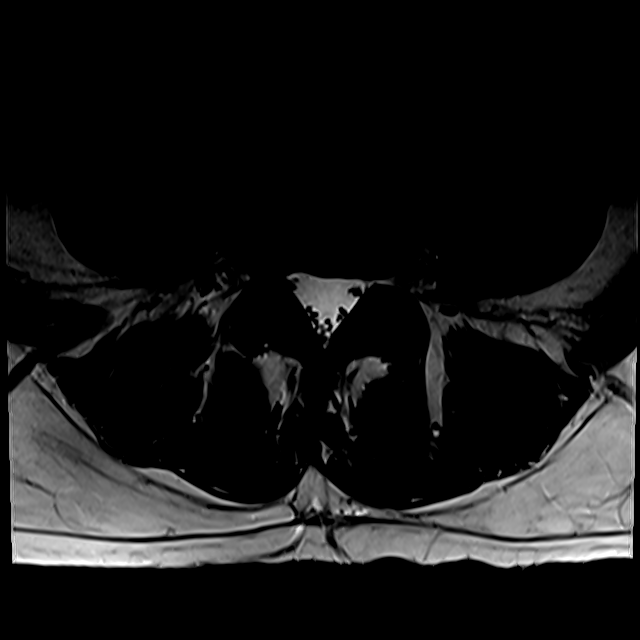
[im 16/43]
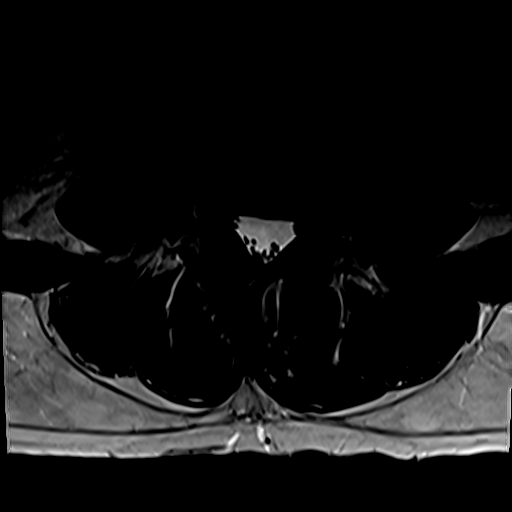
[im 22/43]
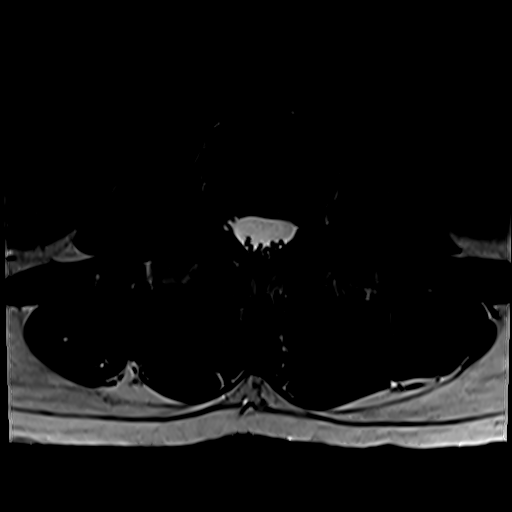
[im 27/43]
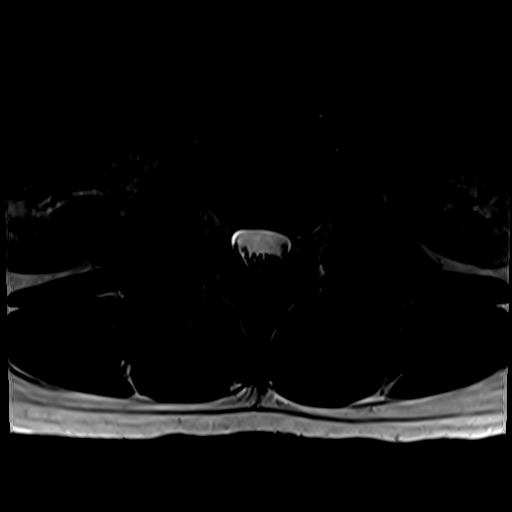
[im 32/43]
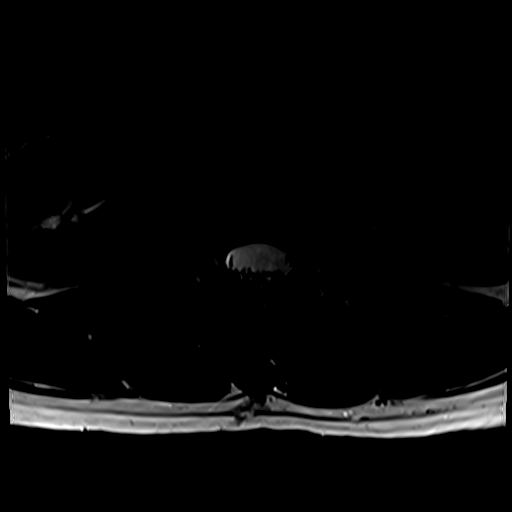
[im 37/43]
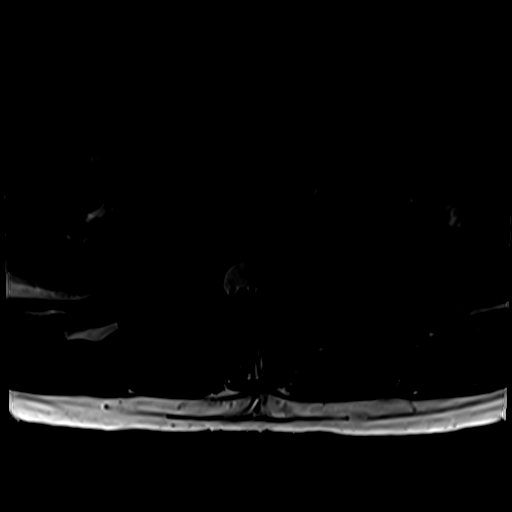
[im 43/43]
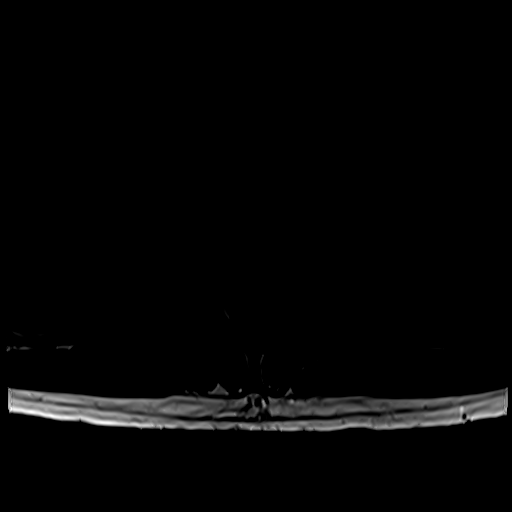

[Series 14: T2 · sagittal · 4.0mm · 0.73mm/px · 3 of 16 slices shown (2 of 2)]
[im 1/16]
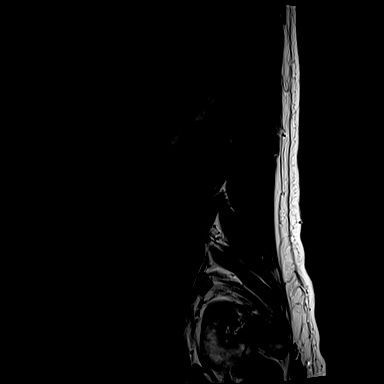
[im 8/16]
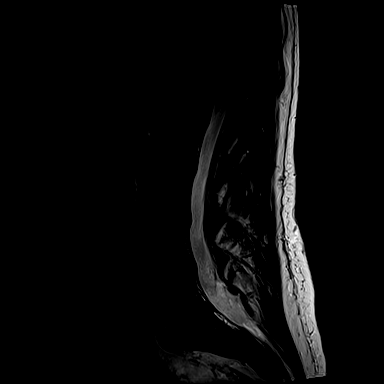
[im 16/16]
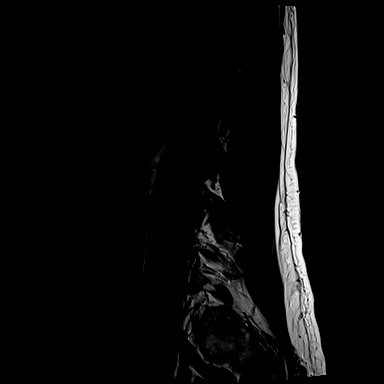

[21 of 48 positions shown; findings below may reference images not displayed]

FINDINGS: Segmentation:  Standard.

Alignment:  Physiologic.

Vertebrae:  No fracture, evidence of discitis, or bone lesion.

Conus medullaris and cauda equina: Conus extends to the L1 level.
Conus and cauda equina appear normal. No concerning abnormal
postcontrast enhancement of the intraspinal contents.

Paraspinal and other soft tissues: Negative.

Disc levels:

L1-L2:  Unremarkable.

L2-L3:  Unremarkable.

L3-L4: Disc desiccation with slight disc space narrowing. Annular
bulge. No impingement.

L4-L5: Disc desiccation with slight disc space narrowing. Annular
rent, with slight bulge. Postcontrast enhancement of the annular
tear. Facet arthropathy. No protrusion or impingement. Facet
arthropathy. No impingement.

L5-S1: Slight disc space narrowing. Annular rent, with slight bulge.
Postcontrast enhancement of the annular tear. Facet arthropathy. No
protrusion or impingement.
IMPRESSION: Minor lumbar degenerative change. No significant disc protrusion or
spinal stenosis.

No abnormal enhancement of the intraspinal contents, including nerve
roots.

## 2019-10-01 DIAGNOSIS — K5792 Diverticulitis of intestine, part unspecified, without perforation or abscess without bleeding: Secondary | ICD-10-CM | POA: Diagnosis not present

## 2019-10-01 DIAGNOSIS — R1032 Left lower quadrant pain: Secondary | ICD-10-CM | POA: Diagnosis not present

## 2019-12-19 DIAGNOSIS — Z23 Encounter for immunization: Secondary | ICD-10-CM | POA: Diagnosis not present

## 2020-05-01 DIAGNOSIS — Z0000 Encounter for general adult medical examination without abnormal findings: Secondary | ICD-10-CM | POA: Diagnosis not present

## 2020-05-01 DIAGNOSIS — G629 Polyneuropathy, unspecified: Secondary | ICD-10-CM | POA: Diagnosis not present

## 2020-05-01 DIAGNOSIS — M545 Low back pain, unspecified: Secondary | ICD-10-CM | POA: Diagnosis not present

## 2020-05-01 DIAGNOSIS — G40909 Epilepsy, unspecified, not intractable, without status epilepticus: Secondary | ICD-10-CM | POA: Diagnosis not present

## 2020-05-01 DIAGNOSIS — E039 Hypothyroidism, unspecified: Secondary | ICD-10-CM | POA: Diagnosis not present

## 2020-05-03 DIAGNOSIS — Z125 Encounter for screening for malignant neoplasm of prostate: Secondary | ICD-10-CM | POA: Diagnosis not present

## 2020-05-03 DIAGNOSIS — E039 Hypothyroidism, unspecified: Secondary | ICD-10-CM | POA: Diagnosis not present

## 2020-05-03 DIAGNOSIS — Z0000 Encounter for general adult medical examination without abnormal findings: Secondary | ICD-10-CM | POA: Diagnosis not present

## 2020-05-03 DIAGNOSIS — G40909 Epilepsy, unspecified, not intractable, without status epilepticus: Secondary | ICD-10-CM | POA: Diagnosis not present

## 2020-05-03 DIAGNOSIS — Z13220 Encounter for screening for lipoid disorders: Secondary | ICD-10-CM | POA: Diagnosis not present

## 2021-10-09 DIAGNOSIS — G4700 Insomnia, unspecified: Secondary | ICD-10-CM | POA: Diagnosis not present

## 2021-10-09 DIAGNOSIS — F411 Generalized anxiety disorder: Secondary | ICD-10-CM | POA: Diagnosis not present

## 2021-10-09 DIAGNOSIS — F322 Major depressive disorder, single episode, severe without psychotic features: Secondary | ICD-10-CM | POA: Diagnosis not present

## 2021-10-31 DIAGNOSIS — F4323 Adjustment disorder with mixed anxiety and depressed mood: Secondary | ICD-10-CM | POA: Diagnosis not present

## 2021-10-31 DIAGNOSIS — Z79899 Other long term (current) drug therapy: Secondary | ICD-10-CM | POA: Diagnosis not present

## 2021-11-04 DIAGNOSIS — Z79899 Other long term (current) drug therapy: Secondary | ICD-10-CM | POA: Diagnosis not present

## 2021-11-25 DIAGNOSIS — F4323 Adjustment disorder with mixed anxiety and depressed mood: Secondary | ICD-10-CM | POA: Diagnosis not present

## 2021-12-19 DIAGNOSIS — Z23 Encounter for immunization: Secondary | ICD-10-CM | POA: Diagnosis not present

## 2021-12-24 DIAGNOSIS — R634 Abnormal weight loss: Secondary | ICD-10-CM | POA: Diagnosis not present

## 2021-12-24 DIAGNOSIS — F411 Generalized anxiety disorder: Secondary | ICD-10-CM | POA: Diagnosis not present

## 2021-12-24 DIAGNOSIS — E039 Hypothyroidism, unspecified: Secondary | ICD-10-CM | POA: Diagnosis not present

## 2022-01-04 DIAGNOSIS — Z1159 Encounter for screening for other viral diseases: Secondary | ICD-10-CM | POA: Diagnosis not present

## 2022-01-27 DIAGNOSIS — E611 Iron deficiency: Secondary | ICD-10-CM | POA: Diagnosis not present

## 2022-01-27 DIAGNOSIS — Z131 Encounter for screening for diabetes mellitus: Secondary | ICD-10-CM | POA: Diagnosis not present

## 2022-01-27 DIAGNOSIS — E559 Vitamin D deficiency, unspecified: Secondary | ICD-10-CM | POA: Diagnosis not present

## 2022-01-27 DIAGNOSIS — E039 Hypothyroidism, unspecified: Secondary | ICD-10-CM | POA: Diagnosis not present

## 2022-01-27 DIAGNOSIS — R634 Abnormal weight loss: Secondary | ICD-10-CM | POA: Diagnosis not present

## 2022-01-27 DIAGNOSIS — F4323 Adjustment disorder with mixed anxiety and depressed mood: Secondary | ICD-10-CM | POA: Diagnosis not present

## 2022-01-27 DIAGNOSIS — R790 Abnormal level of blood mineral: Secondary | ICD-10-CM | POA: Diagnosis not present

## 2022-01-27 DIAGNOSIS — R61 Generalized hyperhidrosis: Secondary | ICD-10-CM | POA: Diagnosis not present

## 2022-01-27 DIAGNOSIS — E538 Deficiency of other specified B group vitamins: Secondary | ICD-10-CM | POA: Diagnosis not present

## 2022-01-27 DIAGNOSIS — E291 Testicular hypofunction: Secondary | ICD-10-CM | POA: Diagnosis not present

## 2022-02-12 DIAGNOSIS — R519 Headache, unspecified: Secondary | ICD-10-CM | POA: Diagnosis not present

## 2022-02-12 DIAGNOSIS — M25511 Pain in right shoulder: Secondary | ICD-10-CM | POA: Diagnosis not present

## 2022-02-26 DIAGNOSIS — E039 Hypothyroidism, unspecified: Secondary | ICD-10-CM | POA: Diagnosis not present

## 2022-02-26 DIAGNOSIS — E291 Testicular hypofunction: Secondary | ICD-10-CM | POA: Diagnosis not present

## 2022-03-27 DIAGNOSIS — E039 Hypothyroidism, unspecified: Secondary | ICD-10-CM | POA: Diagnosis not present

## 2022-06-25 DIAGNOSIS — G40909 Epilepsy, unspecified, not intractable, without status epilepticus: Secondary | ICD-10-CM | POA: Diagnosis not present
